# Patient Record
Sex: Female | Born: 1985 | Hispanic: Yes | Marital: Married | Smoking: Never smoker
Health system: Southern US, Community
[De-identification: ages and names within clinical notes are randomized; demographics above are authoritative.]

## PROBLEM LIST (undated history)

## (undated) DIAGNOSIS — E119 Type 2 diabetes mellitus without complications: Secondary | ICD-10-CM

## (undated) DIAGNOSIS — I319 Disease of pericardium, unspecified: Secondary | ICD-10-CM

## (undated) DIAGNOSIS — E162 Hypoglycemia, unspecified: Secondary | ICD-10-CM

## (undated) DIAGNOSIS — J918 Pleural effusion in other conditions classified elsewhere: Secondary | ICD-10-CM

## (undated) DIAGNOSIS — J189 Pneumonia, unspecified organism: Secondary | ICD-10-CM

## (undated) DIAGNOSIS — M069 Rheumatoid arthritis, unspecified: Secondary | ICD-10-CM

## (undated) DIAGNOSIS — D649 Anemia, unspecified: Secondary | ICD-10-CM

## (undated) HISTORY — PX: GASTRIC BYPASS: SHX52

## (undated) HISTORY — PX: TUBAL LIGATION: SHX77

## (undated) HISTORY — DX: Type 2 diabetes mellitus without complications: E11.9

---

## 2016-09-28 ENCOUNTER — Emergency Department (HOSPITAL_COMMUNITY)
Admission: EM | Admit: 2016-09-28 | Discharge: 2016-09-28 | Disposition: A | Discharge status: Home / Self Care | Payer: Medicaid Other | Attending: Emergency Medicine | Admitting: Emergency Medicine

## 2016-09-28 ENCOUNTER — Encounter (HOSPITAL_COMMUNITY): Payer: Self-pay | Admitting: *Deleted

## 2016-09-28 DIAGNOSIS — N390 Urinary tract infection, site not specified: Secondary | ICD-10-CM | POA: Diagnosis not present

## 2016-09-28 DIAGNOSIS — R1084 Generalized abdominal pain: Secondary | ICD-10-CM | POA: Diagnosis not present

## 2016-09-28 DIAGNOSIS — D649 Anemia, unspecified: Secondary | ICD-10-CM | POA: Diagnosis not present

## 2016-09-28 DIAGNOSIS — R339 Retention of urine, unspecified: Secondary | ICD-10-CM | POA: Diagnosis present

## 2016-09-28 HISTORY — DX: Anemia, unspecified: D64.9

## 2016-09-28 LAB — URINALYSIS, ROUTINE W REFLEX MICROSCOPIC
Bilirubin Urine: NEGATIVE
GLUCOSE, UA: NEGATIVE mg/dL
KETONES UR: NEGATIVE mg/dL
Nitrite: POSITIVE — AB
PROTEIN: NEGATIVE mg/dL
Specific Gravity, Urine: 1.011 (ref 1.005–1.030)
pH: 6 (ref 5.0–8.0)

## 2016-09-28 LAB — POC URINE PREG, ED: PREG TEST UR: NEGATIVE

## 2016-09-28 MED ORDER — CEFTRIAXONE SODIUM 1 G IJ SOLR
500.0000 mg | Freq: Once | INTRAMUSCULAR | Status: AC
  Administered 2016-09-28: 500 mg via INTRAMUSCULAR
  Filled 2016-09-28: qty 10

## 2016-09-28 MED ORDER — CEPHALEXIN 500 MG PO CAPS: 500.0000 mg | ORAL_CAPSULE | Freq: Four times a day (QID) | ORAL | 0 refills | Status: DC

## 2016-09-28 MED ORDER — HYDROCODONE-ACETAMINOPHEN 5-325 MG PO TABS: 1.0000 | ORAL_TABLET | ORAL | 0 refills | Status: DC | PRN

## 2016-09-28 NOTE — ED Notes (Signed)
Rocephin is not in any of ED Pyxis called pharmacy to receive medicine.

## 2016-09-28 NOTE — ED Provider Notes (Signed)
[facility]-EMERGENCY DEPT Provider Note   CSN: [id number] Arrival date & time: 09/28/16  1004     History   Chief Complaint Chief Complaint  Patient presents with  . Flank Pain  . Urinary Retention    HPI Pablo Mathurin is a 31 y.o. female.  Dysuria, frequency, urgency, for 2-3 days with associated right flank pain. No fever, sweats, chills. Patient is able to eat and drink. Status post BTL. She has had gastric bypass surgery. Severity of symptoms is mild to moderate. Nothing makes symptoms better or worse.      Past Medical History:  Diagnosis Date  . Anemia     There are no active problems to display for this patient.   Past Surgical History:  Procedure Laterality Date  . GASTRIC BYPASS      OB History    No data available       Home Medications    Prior to Admission medications   Medication Sig Start Date End Date Taking? Authorizing Provider  cephALEXin (KEFLEX) 500 MG capsule Take 1 capsule (500 mg total) by mouth 4 (four) times daily. 09/28/16   Donnetta Hutching, MD  HYDROcodone-acetaminophen (NORCO/VICODIN) 5-325 MG tablet Take 1-2 tablets by mouth every 4 (four) hours as needed. 09/28/16   Donnetta Hutching, MD    Family History History reviewed. No pertinent family history.  Social History Social History  Substance Use Topics  . Smoking status: Never Smoker  . Smokeless tobacco: Not on file  . Alcohol use No     Allergies   Patient has no known allergies.   Review of Systems Review of Systems  All other systems reviewed and are negative.    Physical Exam Updated Vital Signs BP 116/69 (BP Location: Right Arm)   Pulse 78   Temp 98 F (36.7 C) (Oral)   Resp 16   LMP 09/18/2016   SpO2 99%   Physical Exam  Constitutional: She is oriented to person, place, and time. She appears well-developed and well-nourished.  HENT:  Head: Normocephalic and atraumatic.  Eyes: Conjunctivae are normal.  Neck: Neck supple.  Cardiovascular: Normal rate and regular  rhythm.   Pulmonary/Chest: Effort normal and breath sounds normal.  Abdominal:  Minimal suprapubic tenderness  Genitourinary:  Genitourinary Comments: Minimal right flank tenderness  Musculoskeletal: Normal range of motion.  Neurological: She is alert and oriented to person, place, and time.  Skin: Skin is warm and dry.  Psychiatric: She has a normal mood and affect. Her behavior is normal.  Nursing note and vitals reviewed.    ED Treatments / Results  Labs (all labs ordered are listed, but only abnormal results are displayed) Labs Reviewed  URINALYSIS, ROUTINE W REFLEX MICROSCOPIC - Abnormal; Notable for the following:       Result Value   APPearance HAZY (*)    Hgb urine dipstick SMALL (*)    Nitrite POSITIVE (*)    Leukocytes, UA LARGE (*)    Bacteria, UA MANY (*)    Squamous Epithelial / LPF 0-5 (*)    All other components within normal limits  URINE CULTURE  POC URINE PREG, ED    EKG  EKG Interpretation None       Radiology No results found.  Procedures Procedures (including critical care time)  Medications Ordered in ED Medications  cefTRIAXone (ROCEPHIN) injection 500 mg (not administered)     Initial Impression / Assessment and Plan / ED Course  I have reviewed the triage vital signs and the nursing  notes.  Pertinent labs & imaging results that were available during my care of the patient were reviewed by me and considered in my medical decision making (see chart for details).     Patient is in no acute distress. She is not toxic or septic. Urinalysis shows evidence of infection. Will Rx Rocephin 1 g IM. Discharge medications Keflex 500 mg 4 times a day. Urine culture.  Final Clinical Impressions(s) / ED Diagnoses   Final diagnoses:  Urinary tract infection without hematuria, site unspecified    New Prescriptions New Prescriptions   CEPHALEXIN (KEFLEX) 500 MG CAPSULE    Take 1 capsule (500 mg total) by mouth 4 (four) times daily.    HYDROCODONE-ACETAMINOPHEN (NORCO/VICODIN) 5-325 MG TABLET    Take 1-2 tablets by mouth every 4 (four) hours as needed.     Donnetta Hutching, MD 09/28/16 [phone]

## 2016-09-28 NOTE — ED Notes (Signed)
Pt given specimen cup at triage but states unable to obtain sample at this time.

## 2016-09-28 NOTE — ED Triage Notes (Signed)
Pt reports having difficulty urinating that started yesterday. Reports now only able to pass small amounts of urine. Now has bilateral flank pain and feels like abd is distended. Denies fever.

## 2016-09-28 NOTE — Discharge Instructions (Signed)
You have a urinary infection. Increase fluids. Prescription for antibiotic and pain medicine. Start antibiotic this evening.

## 2016-09-28 NOTE — ED Notes (Signed)
Pt ambulated to restroom. 

## 2016-09-30 LAB — URINE CULTURE

## 2016-10-01 ENCOUNTER — Telehealth: Payer: Self-pay | Admitting: Emergency Medicine

## 2016-10-01 NOTE — Telephone Encounter (Signed)
Post ED Visit - Positive Culture Follow-up  Culture report reviewed by antimicrobial stewardship pharmacist:  []  , Pharm.D. []  Enzo Bi, Pharm.D., BCPS AQ-ID []  , Pharm.D., BCPS []  Celedonio Miyamoto, .D., BCPS []  Re[place], .D., BCPS, AAHIVP []  Georgina Pillion, Pharm.D., BCPS, AAHIVP [x]  [address], PharmD, BCPS []  , PharmD, BCPS []  [place], PharmD, BCPS  Positive urine culture Treated with cephalexin, organism sensitive to the same and no further patient follow-up is required at this time.  [place] 10/01/2016, 11:48 AM

## 2017-04-11 ENCOUNTER — Other Ambulatory Visit: Payer: Self-pay

## 2017-04-11 ENCOUNTER — Encounter (HOSPITAL_BASED_OUTPATIENT_CLINIC_OR_DEPARTMENT_OTHER): Payer: Self-pay

## 2017-04-11 ENCOUNTER — Emergency Department (HOSPITAL_BASED_OUTPATIENT_CLINIC_OR_DEPARTMENT_OTHER)
Admission: EM | Admit: 2017-04-11 | Discharge: 2017-04-12 | Disposition: A | Discharge status: Home / Self Care | Payer: Medicaid Other | Attending: Emergency Medicine | Admitting: Emergency Medicine

## 2017-04-11 DIAGNOSIS — N12 Tubulo-interstitial nephritis, not specified as acute or chronic: Secondary | ICD-10-CM | POA: Diagnosis not present

## 2017-04-11 DIAGNOSIS — R1030 Lower abdominal pain, unspecified: Secondary | ICD-10-CM | POA: Diagnosis present

## 2017-04-11 HISTORY — DX: Pneumonia, unspecified organism: J18.9

## 2017-04-11 HISTORY — DX: Pleural effusion in other conditions classified elsewhere: J91.8

## 2017-04-11 HISTORY — DX: Disease of pericardium, unspecified: I31.9

## 2017-04-11 LAB — CBC WITH DIFFERENTIAL/PLATELET
Basophils Absolute: 0 10*3/uL (ref 0.0–0.1)
Basophils Relative: 0 %
Eosinophils Absolute: 0.1 10*3/uL (ref 0.0–0.7)
Eosinophils Relative: 1 %
HEMATOCRIT: 33.8 % — AB (ref 36.0–46.0)
Hemoglobin: 11.4 g/dL — ABNORMAL LOW (ref 12.0–15.0)
LYMPHS ABS: 2.1 10*3/uL (ref 0.7–4.0)
LYMPHS PCT: 23 %
MCH: 29.5 pg (ref 26.0–34.0)
MCHC: 33.7 g/dL (ref 30.0–36.0)
MCV: 87.3 fL (ref 78.0–100.0)
MONOS PCT: 5 %
Monocytes Absolute: 0.5 10*3/uL (ref 0.1–1.0)
NEUTROS ABS: 6.5 10*3/uL (ref 1.7–7.7)
NEUTROS PCT: 71 %
Platelets: 325 10*3/uL (ref 150–400)
RBC: 3.87 MIL/uL (ref 3.87–5.11)
RDW: 13.4 % (ref 11.5–15.5)
WBC: 9.2 10*3/uL (ref 4.0–10.5)

## 2017-04-11 LAB — URINALYSIS, ROUTINE W REFLEX MICROSCOPIC
BILIRUBIN URINE: NEGATIVE
Glucose, UA: 100 mg/dL — AB
Ketones, ur: NEGATIVE mg/dL
Nitrite: NEGATIVE
PROTEIN: NEGATIVE mg/dL
Specific Gravity, Urine: 1.015 (ref 1.005–1.030)
pH: 6.5 (ref 5.0–8.0)

## 2017-04-11 LAB — URINALYSIS, MICROSCOPIC (REFLEX)

## 2017-04-11 LAB — PREGNANCY, URINE: PREG TEST UR: NEGATIVE

## 2017-04-11 LAB — COMPREHENSIVE METABOLIC PANEL
ALT: 14 U/L (ref 14–54)
ANION GAP: 7 (ref 5–15)
AST: 18 U/L (ref 15–41)
Albumin: 4.4 g/dL (ref 3.5–5.0)
Alkaline Phosphatase: 92 U/L (ref 38–126)
BILIRUBIN TOTAL: 0.3 mg/dL (ref 0.3–1.2)
BUN: 10 mg/dL (ref 6–20)
CALCIUM: 8.9 mg/dL (ref 8.9–10.3)
CO2: 24 mmol/L (ref 22–32)
CREATININE: 0.65 mg/dL (ref 0.44–1.00)
Chloride: 105 mmol/L (ref 101–111)
GFR calc non Af Amer: >60 mL/min (ref >60)
GLUCOSE: 88 mg/dL (ref 65–99)
Potassium: 4.1 mmol/L (ref 3.5–5.1)
Sodium: 136 mmol/L (ref 135–145)
TOTAL PROTEIN: 7.9 g/dL (ref 6.5–8.1)

## 2017-04-11 LAB — LIPASE, BLOOD: Lipase: 27 U/L (ref 11–51)

## 2017-04-11 MED ORDER — ONDANSETRON HCL 4 MG PO TABS: 4.0000 mg | ORAL_TABLET | Freq: Three times a day (TID) | ORAL | 0 refills | Status: DC | PRN

## 2017-04-11 MED ORDER — OXYCODONE-ACETAMINOPHEN 5-325 MG PO TABS: 1.0000 | ORAL_TABLET | ORAL | 0 refills | Status: DC | PRN

## 2017-04-11 MED ORDER — LEVOFLOXACIN 750 MG PO TABS
750.0000 mg | ORAL_TABLET | Freq: Once | ORAL | Status: AC
  Administered 2017-04-11: 750 mg via ORAL
  Filled 2017-04-11: qty 1

## 2017-04-11 MED ORDER — SODIUM CHLORIDE 0.9 % IV BOLUS (SEPSIS)
500.0000 mL | Freq: Once | INTRAVENOUS | Status: AC
  Administered 2017-04-11: 500 mL via INTRAVENOUS

## 2017-04-11 MED ORDER — LEVOFLOXACIN 750 MG PO TABS: 750.0000 mg | ORAL_TABLET | Freq: Every day | ORAL | 0 refills | Status: AC

## 2017-04-11 MED ORDER — MORPHINE SULFATE (PF) 4 MG/ML IV SOLN
4.0000 mg | Freq: Once | INTRAVENOUS | Status: AC
  Administered 2017-04-11: 4 mg via INTRAVENOUS
  Filled 2017-04-11: qty 1

## 2017-04-11 NOTE — Discharge Instructions (Signed)
Your work-up today fits with your history and exam revealing urinary tract infection and pyelonephritis.  Please use the antibiotic to treat the infection.  Please use the pain medicine and nausea medicine to help control your symptoms.  Please stay hydrated and rest.  If any symptoms change or worsen or you unable to manage your hydration, please return to the nearest emergency department.

## 2017-04-11 NOTE — ED Triage Notes (Signed)
C/o lower back pain, chills x 4 days-last dose tylenol 3pm-denies injury-NAD-steady gait

## 2017-04-11 NOTE — ED Provider Notes (Signed)
[facility] EMERGENCY DEPARTMENT Provider Note   CSN: [id number] Arrival date & time: 04/11/17  1818     History   Chief Complaint Chief Complaint  Patient presents with  . Back Pain    HPI Linda Shelton is a 32 y.o. female.  The history is provided by the patient and medical records. No language interpreter was used.  Flank Pain  This is a new problem. The current episode started more than 2 days ago. The problem occurs constantly. The problem has not changed since onset.Associated symptoms include abdominal pain. Pertinent negatives include no chest pain, no headaches and no shortness of breath. Nothing aggravates the symptoms. Nothing relieves the symptoms. She has tried nothing for the symptoms. The treatment provided no relief.    Past Medical History:  Diagnosis Date  . Anemia   . Pericarditis   . Pleural effusion associated with pulmonary infection     There are no active problems to display for this patient.   Past Surgical History:  Procedure Laterality Date  . GASTRIC BYPASS    . TUBAL LIGATION      OB History    No data available       Home Medications    Prior to Admission medications   Not on File    Family History No family history on file.  Social History Social History   Tobacco Use  . Smoking status: Never Smoker  . Smokeless tobacco: Never Used  Substance Use Topics  . Alcohol use: No  . Drug use: No     Allergies   Patient has no known allergies.   Review of Systems Review of Systems  Constitutional: Negative for chills, diaphoresis, fatigue and fever.  HENT: Negative for congestion.   Respiratory: Negative for choking, chest tightness and shortness of breath.   Cardiovascular: Negative for chest pain, palpitations and leg swelling.  Gastrointestinal: Positive for abdominal pain and nausea. Negative for constipation, diarrhea and vomiting.  Genitourinary: Positive for flank pain and frequency. Negative for  difficulty urinating, dysuria, vaginal bleeding, vaginal discharge and vaginal pain.  Musculoskeletal: Positive for back pain. Negative for neck pain and neck stiffness.  Skin: Negative for rash and wound.  Neurological: Negative for light-headedness and headaches.  Psychiatric/Behavioral: Negative for agitation and confusion.  All other systems reviewed and are negative.    Physical Exam Updated Vital Signs BP 108/72 (BP Location: Left Arm)   Pulse 80   Temp 98.4 F (36.9 C) (Oral)   Resp 18   Ht 5\' 3"  (1.6 m)   Wt 75.3 kg (166 lb)   LMP 04/07/2017   SpO2 100%   BMI 29.41 kg/m   Physical Exam  Constitutional: She is oriented to person, place, and time. She appears well-developed and well-nourished. No distress.  HENT:  Head: Normocephalic.  Nose: Nose normal.  Mouth/Throat: No oropharyngeal exudate.  Eyes: Conjunctivae and EOM are normal. Pupils are equal, round, and reactive to light.  Neck: Normal range of motion. Neck supple.  Cardiovascular: Normal rate, regular rhythm and intact distal pulses.  No murmur heard. Pulmonary/Chest: Effort normal and breath sounds normal. No respiratory distress. She has no wheezes. She has no rales. She exhibits no tenderness.  Abdominal: Normal appearance and bowel sounds are normal. She exhibits no distension and no mass. There is tenderness in the suprapubic area. There is CVA tenderness. There is no guarding.    Musculoskeletal: She exhibits tenderness. She exhibits no edema.       Lumbar  back: She exhibits tenderness and pain.       Back:  Lymphadenopathy:    She has no cervical adenopathy.  Neurological: She is alert and oriented to person, place, and time. She displays normal reflexes. No sensory deficit. She exhibits normal muscle tone.  Skin: Skin is warm. Capillary refill takes less than 2 seconds. No rash noted. She is not diaphoretic. No erythema.  Nursing note and vitals reviewed.    ED Treatments / Results  Labs (all  labs ordered are listed, but only abnormal results are displayed) Labs Reviewed  URINALYSIS, ROUTINE W REFLEX MICROSCOPIC - Abnormal; Notable for the following components:      Result Value   Glucose, UA 100 (*)    Hgb urine dipstick MODERATE (*)    Leukocytes, UA SMALL (*)    All other components within normal limits  URINALYSIS, MICROSCOPIC (REFLEX) - Abnormal; Notable for the following components:   Bacteria, UA FEW (*)    Squamous Epithelial / LPF 0-5 (*)    All other components within normal limits  CBC WITH DIFFERENTIAL/PLATELET - Abnormal; Notable for the following components:   Hemoglobin 11.4 (*)    HCT 33.8 (*)    All other components within normal limits  PREGNANCY, URINE  COMPREHENSIVE METABOLIC PANEL  LIPASE, BLOOD    EKG  EKG Interpretation None       Radiology No results found.  Procedures Procedures (including critical care time)  Medications Ordered in ED Medications  morphine 4 MG/ML injection 4 mg (4 mg Intravenous Given 04/11/17 2043)  sodium chloride 0.9 % bolus 500 mL (0 mLs Intravenous Stopped 04/11/17 2115)  levofloxacin (LEVAQUIN) tablet 750 mg (750 mg Oral Given 04/11/17 2332)     Initial Impression / Assessment and Plan / ED Course  I have reviewed the triage vital signs and the nursing notes.  Pertinent labs & imaging results that were available during my care of the patient were reviewed by me and considered in my medical decision making (see chart for details).     Linda Shelton is a 32 y.o. female with a past medical history significant for chronic anemia, prior pleural effusion, prior pericarditis, and remote history of gastric bypass surgery who presents with bilateral flank and back pain, some mild lower abdominal discomfort, and some mild diffuse headache.  Patient reports that for the last few days, she has had the bilateral flank and back pain.  She reports that she has not had fevers or chills.  She describes the pain radiating  around her flanks towards her lower mid abdomen.  She denies any pelvic pain, vaginal discharge and reports she just finished her menstrual cycle.  She denies any pelvic pain.  She denies any history of kidney stones but does report a history of UTIs.  She was concerned that she had a UTI or pyelonephritis.  She does report some diffuse headache but denies any nuchal rigidity or neck stiffness.  She denies any constipation, diarrhea, or upper respiratory symptoms.  He denies any chest pain, palpitations, or shortness of breath.  She denies any trauma to her back.  She reports that the back pain is a 6 out of 10 severity and is in both flanks and both lateral back.  She reports it as a sharp and needlelike pain bilaterally.  She reports it is worse with movement and palpation.  She reports taking Tylenol which did not help her symptoms fully.  She denies other complaints.    On exam,  patient does have bilateral CVA tenderness and bilateral flank tenderness.  Mid lower abdomen is slightly tender to palpation but the rest of her abdomen is benign.  No rebound tenderness or guarding.  No lower extremity tenderness or pain.  Patient had no focal neurologic deficits on my exam.  Lungs were clear and chest was nontender.  Normal neck range of motion.  Suspect UTI and pyelonephritis given the patient's symptoms.  With no history of kidney stone, suspect any hematuria to be due to her recent menstrual cycle.  Patient will have screening laboratory testing, urinalysis, and will also be given pain medicine during workup.  Given the benign abdominal exam, do not feel she has a combination with her gastric bypass.  Given her reassuring neck exam and neuro exam, do not feel patient has a meningitis.  Anticipate reassessment after workup.        Diagnostic laboratory testing seen above.  Lipase not elevated.  Metabolic panel reassuring.  CBC shows mild anemia but no leukocytosis.  Urinalysis did show evidence of leukocytes  and bacteria.  In the setting of the patient's abdominal discomfort and pain rating to her bilateral flanks and history of UTIs with some frequency, patient will be treated for UTI and pyelonephritis.   Patient did have some hemoglobin on her urine however she reports that she is finishing her menstrual cycle.  Suspect this is contamination.  Patient has no history of kidney stones and her symptoms are bilateral, doubt bilateral kidney stones at this time.    Patien was feeling better after medications.  Patient will be treated for pyelonephritis with antibiotics as well as nausea medication.  Pain medicine will also be given.  Patient will follow up with PCP in several days.  Patient understood return precautions for any new or worsened symptoms.  Patient had no depressions or concerns and was discharged in good condition.    Final Clinical Impressions(s) / ED Diagnoses   Final diagnoses:  Pyelonephritis    ED Discharge Orders        Ordered    levofloxacin (LEVAQUIN) 750 MG tablet  Daily     04/11/17 2320    ondansetron (ZOFRAN) 4 MG tablet  Every 8 hours PRN     04/11/17 2320    oxyCODONE-acetaminophen (PERCOCET/ROXICET) 5-325 MG tablet  Every 4 hours PRN     04/11/17 2320      Clinical Impression: 1. Pyelonephritis     Disposition: Discharge  Condition: Good  I have discussed the results, Dx and Tx plan with the pt(& family if present). He/she/they expressed understanding and agree(s) with the plan. Discharge instructions discussed at great length. Strict return precautions discussed and pt &/or family have verbalized understanding of the instructions. No further questions at time of discharge.    New Prescriptions   LEVOFLOXACIN (LEVAQUIN) 750 MG TABLET    Take 1 tablet (750 mg total) by mouth daily for 5 days.   ONDANSETRON (ZOFRAN) 4 MG TABLET    Take 1 tablet (4 mg total) by mouth every 8 (eight) hours as needed for nausea or vomiting.   OXYCODONE-ACETAMINOPHEN  (PERCOCET/ROXICET) 5-325 MG TABLET    Take 1 tablet by mouth every 4 (four) hours as needed for severe pain.    Follow Up: [facility] [address] [place] [address] [phone] Schedule an appointment as soon as possible for a visit       Kiara Keep, Canary Brim, MD 04/12/17 [phone]

## 2019-01-19 ENCOUNTER — Emergency Department (HOSPITAL_BASED_OUTPATIENT_CLINIC_OR_DEPARTMENT_OTHER)
Admission: EM | Admit: 2019-01-19 | Discharge: 2019-01-19 | Disposition: A | Discharge status: Home / Self Care | Payer: Medicaid Other | Attending: Emergency Medicine | Admitting: Emergency Medicine

## 2019-01-19 ENCOUNTER — Encounter (HOSPITAL_BASED_OUTPATIENT_CLINIC_OR_DEPARTMENT_OTHER): Payer: Self-pay | Admitting: *Deleted

## 2019-01-19 ENCOUNTER — Other Ambulatory Visit: Payer: Self-pay

## 2019-01-19 ENCOUNTER — Emergency Department (HOSPITAL_BASED_OUTPATIENT_CLINIC_OR_DEPARTMENT_OTHER): Payer: Medicaid Other

## 2019-01-19 DIAGNOSIS — M069 Rheumatoid arthritis, unspecified: Secondary | ICD-10-CM | POA: Insufficient documentation

## 2019-01-19 DIAGNOSIS — Z79899 Other long term (current) drug therapy: Secondary | ICD-10-CM | POA: Diagnosis not present

## 2019-01-19 DIAGNOSIS — J40 Bronchitis, not specified as acute or chronic: Secondary | ICD-10-CM

## 2019-01-19 DIAGNOSIS — R0981 Nasal congestion: Secondary | ICD-10-CM | POA: Insufficient documentation

## 2019-01-19 DIAGNOSIS — U071 COVID-19: Secondary | ICD-10-CM | POA: Diagnosis not present

## 2019-01-19 DIAGNOSIS — R05 Cough: Secondary | ICD-10-CM | POA: Diagnosis present

## 2019-01-19 HISTORY — DX: Rheumatoid arthritis, unspecified: M06.9

## 2019-01-19 HISTORY — DX: Hypoglycemia, unspecified: E16.2

## 2019-01-19 LAB — PREGNANCY, URINE: Preg Test, Ur: NEGATIVE

## 2019-01-19 MED ORDER — FLUTICASONE PROPIONATE 50 MCG/ACT NA SUSP: 1.0000 | Freq: Every day | NASAL | 1 refills | Status: DC

## 2019-01-19 MED ORDER — CETIRIZINE-PSEUDOEPHEDRINE ER 5-120 MG PO TB12: 1.0000 | ORAL_TABLET | Freq: Every day | ORAL | 0 refills | Status: DC

## 2019-01-19 MED ORDER — BENZONATATE 100 MG PO CAPS: 100.0000 mg | ORAL_CAPSULE | Freq: Three times a day (TID) | ORAL | 0 refills | Status: DC

## 2019-01-19 MED ORDER — ALBUTEROL SULFATE HFA 108 (90 BASE) MCG/ACT IN AERS: 1.0000 | INHALATION_SPRAY | Freq: Four times a day (QID) | RESPIRATORY_TRACT | 0 refills | Status: DC | PRN

## 2019-01-19 NOTE — ED Provider Notes (Signed)
[facility] EMERGENCY DEPARTMENT Provider Note   CSN: [id number] Arrival date & time: 01/19/19  1350     History Chief Complaint  Patient presents with  . Chest Pain    Linda Shelton is a 33 y.o. female   HPI  Patient is a 33 year old female with a history of pericarditis, rheumatoid arthritis and bronchitis presents today with 6 days of cough and congestion.  Patient states cough is productive of clear/yellow sputum.  States that she has had some sinus congestion which is draining well however she does feel there is some sinus pressure at times.  Denies any fevers.  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  Patient denies any loss of taste or smell.  Patient does endorse some chest tightness that occurs when she coughs.  Patient states she has a history of pericarditis that occurred 4 years ago and had a pleural effusion at that time.  States that her symptoms do not feel similar to this at this time denies any pleuritic chest pain, pain with reclining, shortness of breath apart from coughing spells.  Patient states she used her daughter's nebulized albuterol last night which improved her symptoms significantly.  Patient states she is used some over-the-counter medications including Zyrtec with some improvement.  Patient has no hemoptysis, history of blood clots, diabetes, hypertension, hyperlipidemia, first-degree relative with heart attack under 65, obesity.  Patient has had no surgeries in the past 2 months states that she is very mobile at home.  Denies immobility.  Denies leg swelling cramping or swelling.     Past Medical History:  Diagnosis Date  . Anemia   . Hypoglycemia   . Pericarditis   . Pleural effusion associated with pulmonary infection   . RA (rheumatoid arthritis) (HCC)     There are no problems to display for this patient.   Past Surgical History:   Procedure Laterality Date  . GASTRIC BYPASS    . TUBAL LIGATION       OB History   No obstetric history on file.     No family history on file.  Social History   Tobacco Use  . Smoking status: Never Smoker  . Smokeless tobacco: Never Used  Substance Use Topics  . Alcohol use: No  . Drug use: No    Home Medications Prior to Admission medications   Medication Sig Start Date End Date Taking? Authorizing Provider  acarbose (PRECOSE) 50 MG tablet Take by mouth. 11/21/18  Yes [provider]  hydroxychloroquine (PLAQUENIL) 200 MG tablet Take by mouth. 10/08/18  Yes [provider]  omeprazole (PRILOSEC) 20 MG capsule Take by mouth. 08/15/18  Yes [provider]  predniSONE (DELTASONE) 5 MG tablet Take by mouth. 10/08/18  Yes [provider]  Prenatal Vit-Fe Fumarate-FA (PRENATAL MULTIVITAMIN) TABS tablet Take 1 tablet by mouth daily at 12 noon.   Yes [provider]  albuterol (VENTOLIN HFA) 108 (90 Base) MCG/ACT inhaler Inhale 1-2 puffs into the lungs every 6 (six) hours as needed for wheezing or shortness of breath. 01/19/19   Tedd Sias, PA  ascorbic acid (VITAMIN C) 1000 MG tablet Take by mouth.    [provider]  benzonatate (TESSALON) 100 MG capsule Take 1 capsule (100 mg total) by mouth every 8 (eight) hours. 01/19/19   Tedd Sias, PA  cetirizine-pseudoephedrine (ZYRTEC-D) 5-120 MG tablet Take 1 tablet  by mouth daily. 01/19/19   Kemper Hochman, Rodrigo RanWylder S, PA  fluticasone (FLONASE) 50 MCG/ACT nasal spray Place 1 spray into both nostrils daily. 01/19/19   Chez Bulnes, Rodrigo RanWylder S, PA  ondansetron (ZOFRAN) 4 MG tablet Take 1 tablet (4 mg total) by mouth every 8 (eight) hours as needed for nausea or vomiting. 04/11/17   Tegeler, Canary Brimhristopher J, MD  oxyCODONE-acetaminophen (PERCOCET/ROXICET) 5-325 MG tablet Take 1 tablet by mouth every 4 (four) hours as needed for severe pain. 04/11/17   Tegeler, Canary Brimhristopher J, MD    Allergies     Patient has no known allergies.  Review of Systems   Review of Systems  Constitutional: Positive for fatigue. Negative for chills and fever.  HENT: Positive for congestion.   Eyes: Negative for pain.  Respiratory: Positive for cough. Negative for shortness of breath and wheezing.   Cardiovascular: Negative for chest pain, palpitations and leg swelling.  Gastrointestinal: Negative for abdominal pain and vomiting.  Genitourinary: Negative for dysuria.  Musculoskeletal: Negative for myalgias.       Diffuse body aches  Skin: Negative for rash.  Neurological: Negative for dizziness and headaches.    Physical Exam Updated Vital Signs BP 115/71 (BP Location: Right Arm)   Pulse 81   Temp 98.3 F (36.8 C) (Oral)   Resp 16   Ht 5\' 3"  (1.6 m)   Wt 81.2 kg   SpO2 100%   BMI 31.71 kg/m   Physical Exam Vitals and nursing note reviewed.  Constitutional:      General: She is not in acute distress.    Comments: Patient is pleasant 33 year old female appears stated age speaking full sentences able to answer questions appropriately no increased work of breathing or respiratory distress.  Respiratory rate is 16-18 during ED provider evaluation  HENT:     Head: Normocephalic and atraumatic.     Nose: Nose normal.  Eyes:     General: No scleral icterus. Cardiovascular:     Rate and Rhythm: Normal rate and regular rhythm.     Pulses: Normal pulses.     Heart sounds: Normal heart sounds.     Comments: Pulses all 4 extremities. Pulmonary:     Effort: Pulmonary effort is normal. No respiratory distress.     Breath sounds: No wheezing.     Comments: Normal work of breathing.  No tachypnea Abdominal:     Palpations: Abdomen is soft.     Tenderness: There is no abdominal tenderness.  Musculoskeletal:     Cervical back: Normal range of motion.     Right lower leg: No edema.     Left lower leg: No edema.     Comments: No lower extremity edema swelling or calf tenderness  Skin:    General:  Skin is warm and dry.     Capillary Refill: Capillary refill takes less than 2 seconds.  Neurological:     Mental Status: She is alert. Mental status is at baseline.  Psychiatric:        Mood and Affect: Mood normal.        Behavior: Behavior normal.     ED Results / Procedures / Treatments   Labs (all labs ordered are listed, but only abnormal results are displayed) Labs Reviewed  SARS CORONAVIRUS 2 (TAT 6-24 HRS)  PREGNANCY, URINE    EKG EKG Interpretation  Date/Time:  Saturday January 19 2019 13:57:49 EST Ventricular Rate:  98 PR Interval:  116 QRS Duration: 90 QT Interval:  338 QTC Calculation: 431 R  Axis:   66 Text Interpretation: Normal sinus rhythm Normal ECG No old tracing to compare Confirmed by Meridee ScoreButler, Michael [phone]([id number]) on 01/19/2019 2:02:35 PM   Radiology DG Chest 2 View  Result Date: 01/19/2019 CLINICAL DATA:  Mid chest and upper back pain with cough and shortness of breath for 6-7 days. History of pericarditis and bronchitis. EXAM: CHEST - 2 VIEW COMPARISON:  Radiographs 10/07/2015. FINDINGS: The heart size and mediastinal contours are normal. The lungs are clear. There is no pleural effusion or pneumothorax. No acute osseous findings are identified. Stable mild thoracolumbar scoliosis. IMPRESSION: Stable chest. No active cardiopulmonary process. Electronically Signed   By: Carey BullocksWilliam  Veazey M.D.   On: 01/19/2019 15:02    Procedures Procedures (including critical care time)  Medications Ordered in ED Medications - No data to display  ED Course  I have reviewed the triage vital signs and the nursing notes.  Pertinent labs & imaging results that were available during my care of the patient were reviewed by me and considered in my medical decision making (see chart for details).  Clinical Course as of Jan 18 1837  Sat Jan 19, 2019  1558 With no evidence of ischemia.  No evidence of hokum, WPW, LGG, QT normal.   EKG 12-Lead [WF]  1837 Chest x-ray with no  evidence of infiltrate, also enlargement of the heart.  I personally reviewed the x-ray did not see any signs of pericardial effusion.  DG Chest 2 View [WF]  1837 Negative pregnancy test.  Pregnancy, urine [WF]  1837 Send out Covid lab pending at time of discharge  SARS CORONAVIRUS 2 (TAT 6-24 HRS) Nasopharyngeal Nasopharyngeal Swab [WF]    Clinical Course User Index [WF] Gailen ShelterFondaw, Mercer Stallworth S, PA   MDM Rules/Calculators/A&P                      Patient is a 33 year old female with a history of pericarditis, rheumatoid arthritis and bronchitis presents today with 6 days of cough and congestion.  Patient states cough is productive of clear/yellow sputum.  States that she has had some sinus congestion which is draining well however she does feel there is some sinus pressure at times.  Denies any fevers.  See chest x-ray and other lab test interpretation above.  Patient has vitals are normal limits.  No evidence of pericardial effusion on chest x-ray.  EKG without any evidence of pericarditis no PR depression or ST elevation.  Discussed with patient that physical exam are most consistent with bronchitis.  Will provide patient with albuterol inhaler, benzonatate for cough suppression, Zyrtec-D as patient has no hypertension and is complaining of severe congestion.  Also prescribed fluticasone.  Doubt the patient has sinus infection as she has had symptoms for less than 10 days.  Also she is afebrile and has no maxillary sinus tenderness or frontal sinus tenderness.  Discussed return precautions with patient she is understanding of these.  Doubt pulmonary embolism as patient has a history of clots and is not tachycardic nor is she hypoxic or tachypneic.  Denies any chest pain.   Final Clinical Impression(s) / ED Diagnoses Final diagnoses:  Bronchitis  Sinus congestion    Rx / DC Orders ED Discharge Orders         Ordered    cetirizine-pseudoephedrine (ZYRTEC-D) 5-120 MG tablet  Daily     01/19/19  1642    fluticasone (FLONASE) 50 MCG/ACT nasal spray  Daily     01/19/19 1642    benzonatate (  TESSALON) 100 MG capsule  Every 8 hours     01/19/19 1642    albuterol (VENTOLIN HFA) 108 (90 Base) MCG/ACT inhaler  Every 6 hours PRN     01/19/19 1642           Gailen Shelter, [place] 01/19/19 2023    Laurence Spates, MD 01/20/19 1524

## 2019-01-19 NOTE — Discharge Instructions (Addendum)
I diagnosed you with bronchitis today.  He also have some sinus congestion.  I will prescribe you albuterol inhaler, benzonatate which he can use as needed for cough during the daytime as it is nondrowsy.  Continue to use the cough syrup you have been using at nighttime.  I have also prescribed fluticasone which you should use 1 spray into both nostrils daily.  Lastly I prescribed you Zyrtec-D which has Sudafed in it.  You should take this instead of Zyrtec or Sudafed by itself.  Please return to ED if you have any new or concerning symptoms.  Your COVID test is pending; you should expect results in 2-3 days. You can access your results on your MyChart--if you test positive you should receive a phone call.  In the meantime follow CDC guidelines and quarantine, wear a mask, wash hands often.   Please take over the counter vitamin D 2000-4000 units per day. I also recommend zinc 50 mg per day for the next two weeks.   Please return to ED if you feel have difficulty breathing or have emergent, new or concerning symptoms.  Patients who have symptoms consistent with COVID-19 should self isolated for: At least 3 days (72 hours) have passed since recovery, defined as resolution of fever without the use of fever reducing medications and improvement in respiratory symptoms (e.g., cough, shortness of breath), and At least 7 days have passed since symptoms first appeared.       Person Under Monitoring Name: Linda Shelton  Location: [address] [place] [address]   Infection Prevention Recommendations for Individuals Confirmed to have, or Being Evaluated for, 2019 Novel Coronavirus (COVID-19) Infection Who Receive Care at Home  Individuals who are confirmed to have, or are being evaluated for, COVID-19 should follow the prevention steps below until a healthcare provider or local or state health department says they can return to normal activities.  Stay home except to get medical care You  should restrict activities outside your home, except for getting medical care. Do not go to work, school, or public areas, and do not use public transportation or taxis.  Call ahead before visiting your doctor Before your medical appointment, call the healthcare provider and tell them that you have, or are being evaluated for, COVID-19 infection. This will help the healthcare provider's office take steps to keep other people from getting infected. Ask your healthcare provider to call the local or state health department.  Monitor your symptoms Seek prompt medical attention if your illness is worsening (e.g., difficulty breathing). Before going to your medical appointment, call the healthcare provider and tell them that you have, or are being evaluated for, COVID-19 infection. Ask your healthcare provider to call the local or state health department.  Wear a facemask You should wear a facemask that covers your nose and mouth when you are in the same room with other people and when you visit a healthcare provider. People who live with or visit you should also wear a facemask while they are in the same room with you.  Separate yourself from other people in your home As much as possible, you should stay in a different room from other people in your home. Also, you should use a separate bathroom, if available.  Avoid sharing household items You should not share dishes, drinking glasses, cups, eating utensils, towels, bedding, or other items with other people in your home. After using these items, you should wash them thoroughly with soap and water.  Cover  your coughs and sneezes Cover your mouth and nose with a tissue when you cough or sneeze, or you can cough or sneeze into your sleeve. Throw used tissues in a lined trash can, and immediately wash your hands with soap and water for at least 20 seconds or use an alcohol-based hand rub.  Wash your [employer] your hands often and thoroughly  with soap and water for at least 20 seconds. You can use an alcohol-based hand sanitizer if soap and water are not available and if your hands are not visibly dirty. Avoid touching your eyes, nose, and mouth with unwashed hands.   Prevention Steps for Caregivers and Household Members of Individuals Confirmed to have, or Being Evaluated for, COVID-19 Infection Being Cared for in the Home  If you live with, or provide care at home for, a person confirmed to have, or being evaluated for, COVID-19 infection please follow these guidelines to prevent infection:  Follow healthcare provider's instructions Make sure that you understand and can help the patient follow any healthcare provider instructions for all care.  Provide for the patient's basic needs You should help the patient with basic needs in the home and provide support for getting groceries, prescriptions, and other personal needs.  Monitor the patient's symptoms If they are getting sicker, call his or her medical provider and tell them that the patient has, or is being evaluated for, COVID-19 infection. This will help the healthcare provider's office take steps to keep other people from getting infected. Ask the healthcare provider to call the local or state health department.  Limit the number of people who have contact with the patient If possible, have only one caregiver for the patient. Other household members should stay in another home or place of residence. If this is not possible, they should stay in another room, or be separated from the patient as much as possible. Use a separate bathroom, if available. Restrict visitors who do not have an essential need to be in the home.  Keep older adults, very young children, and other sick people away from the patient Keep older adults, very young children, and those who have compromised immune systems or chronic health conditions away from the patient. This includes people with  chronic heart, lung, or kidney conditions, diabetes, and cancer.  Ensure good ventilation Make sure that shared spaces in the home have good air flow, such as from an air conditioner or an opened window, weather permitting.  Wash your hands often Wash your hands often and thoroughly with soap and water for at least 20 seconds. You can use an alcohol based hand sanitizer if soap and water are not available and if your hands are not visibly dirty. Avoid touching your eyes, nose, and mouth with unwashed hands. Use disposable paper towels to dry your hands. If not available, use dedicated cloth towels and replace them when they become wet.  Wear a facemask and gloves Wear a disposable facemask at all times in the room and gloves when you touch or have contact with the patient's blood, body fluids, and/or secretions or excretions, such as sweat, saliva, sputum, nasal mucus, vomit, urine, or feces.  Ensure the mask fits over your nose and mouth tightly, and do not touch it during use. Throw out disposable facemasks and gloves after using them. Do not reuse. Wash your hands immediately after removing your facemask and gloves. If your personal clothing becomes contaminated, carefully remove clothing and launder. Wash your hands after handling contaminated  clothing. Place all used disposable facemasks, gloves, and other waste in a lined container before disposing them with other household waste. Remove gloves and wash your hands immediately after handling these items.  Do not share dishes, glasses, or other household items with the patient Avoid sharing household items. You should not share dishes, drinking glasses, cups, eating utensils, towels, bedding, or other items with a patient who is confirmed to have, or being evaluated for, COVID-19 infection. After the person uses these items, you should wash them thoroughly with soap and water.  Wash laundry thoroughly Immediately remove and wash clothes  or bedding that have blood, body fluids, and/or secretions or excretions, such as sweat, saliva, sputum, nasal mucus, vomit, urine, or feces, on them. Wear gloves when handling laundry from the patient. Read and follow directions on labels of laundry or clothing items and detergent. In general, wash and dry with the warmest temperatures recommended on the label.  Clean all areas the individual has used often Clean all touchable surfaces, such as counters, tabletops, doorknobs, bathroom fixtures, toilets, phones, keyboards, tablets, and bedside tables, every day. Also, clean any surfaces that may have blood, body fluids, and/or secretions or excretions on them. Wear gloves when cleaning surfaces the patient has come in contact with. Use a diluted bleach solution (e.g., dilute bleach with 1 part bleach and 10 parts water) or a household disinfectant with a label that says EPA-registered for coronaviruses. To make a bleach solution at home, add 1 tablespoon of bleach to 1 quart (4 cups) of water. For a larger supply, add  cup of bleach to 1 gallon (16 cups) of water. Read labels of cleaning products and follow recommendations provided on product labels. Labels contain instructions for safe and effective use of the cleaning product including precautions you should take when applying the product, such as wearing gloves or eye protection and making sure you have good ventilation during use of the product. Remove gloves and wash hands immediately after cleaning.  Monitor yourself for signs and symptoms of illness Caregivers and household members are considered close contacts, should monitor their health, and will be asked to limit movement outside of the home to the extent possible. Follow the monitoring steps for close contacts listed on the symptom monitoring form.   ? If you have additional questions, contact your local health department or call the epidemiologist on call at [phone] (available  24/7). ? This guidance is subject to change. For the most up-to-date guidance from [facility], please refer to their website: YouBlogs.pl

## 2019-01-19 NOTE — ED Triage Notes (Signed)
Pt reports generalized chest tightness x 3-4 days and feels SOB. Had neg covid test on 12/10 and has been home since. States she often gets bronchitis this time of year

## 2019-01-20 LAB — SARS CORONAVIRUS 2 (TAT 6-24 HRS): SARS Coronavirus 2: POSITIVE — AB

## 2019-03-03 ENCOUNTER — Emergency Department (HOSPITAL_COMMUNITY): Payer: Medicaid Other

## 2019-03-03 ENCOUNTER — Encounter (HOSPITAL_COMMUNITY)
Admission: EM | Disposition: A | Discharge status: Home / Self Care | Payer: Self-pay | Source: Home / Self Care | Attending: Emergency Medicine

## 2019-03-03 ENCOUNTER — Emergency Department (HOSPITAL_COMMUNITY): Payer: Medicaid Other | Admitting: Anesthesiology

## 2019-03-03 ENCOUNTER — Other Ambulatory Visit: Payer: Self-pay

## 2019-03-03 ENCOUNTER — Encounter (HOSPITAL_COMMUNITY): Payer: Self-pay | Admitting: Emergency Medicine

## 2019-03-03 ENCOUNTER — Observation Stay (HOSPITAL_COMMUNITY)
Admission: EM | Admit: 2019-03-03 | Discharge: 2019-03-04 | Disposition: A | Discharge status: Home / Self Care | Payer: Medicaid Other | Attending: General Surgery | Admitting: General Surgery

## 2019-03-03 DIAGNOSIS — M1990 Unspecified osteoarthritis, unspecified site: Secondary | ICD-10-CM | POA: Diagnosis not present

## 2019-03-03 DIAGNOSIS — Z79899 Other long term (current) drug therapy: Secondary | ICD-10-CM | POA: Insufficient documentation

## 2019-03-03 DIAGNOSIS — K3580 Unspecified acute appendicitis: Secondary | ICD-10-CM | POA: Diagnosis not present

## 2019-03-03 DIAGNOSIS — Z9884 Bariatric surgery status: Secondary | ICD-10-CM | POA: Insufficient documentation

## 2019-03-03 DIAGNOSIS — M069 Rheumatoid arthritis, unspecified: Secondary | ICD-10-CM | POA: Insufficient documentation

## 2019-03-03 DIAGNOSIS — D649 Anemia, unspecified: Secondary | ICD-10-CM | POA: Insufficient documentation

## 2019-03-03 DIAGNOSIS — Z8616 Personal history of COVID-19: Secondary | ICD-10-CM | POA: Insufficient documentation

## 2019-03-03 DIAGNOSIS — Z7952 Long term (current) use of systemic steroids: Secondary | ICD-10-CM | POA: Diagnosis not present

## 2019-03-03 DIAGNOSIS — K5900 Constipation, unspecified: Secondary | ICD-10-CM | POA: Insufficient documentation

## 2019-03-03 DIAGNOSIS — E162 Hypoglycemia, unspecified: Secondary | ICD-10-CM | POA: Diagnosis not present

## 2019-03-03 DIAGNOSIS — K353 Acute appendicitis with localized peritonitis, without perforation or gangrene: Secondary | ICD-10-CM

## 2019-03-03 DIAGNOSIS — J90 Pleural effusion, not elsewhere classified: Secondary | ICD-10-CM | POA: Insufficient documentation

## 2019-03-03 HISTORY — PX: LAPAROSCOPIC APPENDECTOMY: SHX408

## 2019-03-03 LAB — URINALYSIS, ROUTINE W REFLEX MICROSCOPIC
Bilirubin Urine: NEGATIVE
Glucose, UA: NEGATIVE mg/dL
Hgb urine dipstick: NEGATIVE
Ketones, ur: NEGATIVE mg/dL
Leukocytes,Ua: NEGATIVE
Nitrite: NEGATIVE
Protein, ur: NEGATIVE mg/dL
Specific Gravity, Urine: 1.013 (ref 1.005–1.030)
pH: 6 (ref 5.0–8.0)

## 2019-03-03 LAB — COMPREHENSIVE METABOLIC PANEL
ALT: 14 U/L (ref 0–44)
AST: 22 U/L (ref 15–41)
Albumin: 4 g/dL (ref 3.5–5.0)
Alkaline Phosphatase: 65 U/L (ref 38–126)
Anion gap: 13 (ref 5–15)
BUN: 9 mg/dL (ref 6–20)
CO2: 22 mmol/L (ref 22–32)
Calcium: 9 mg/dL (ref 8.9–10.3)
Chloride: 104 mmol/L (ref 98–111)
Creatinine, Ser: 0.74 mg/dL (ref 0.44–1.00)
GFR calc Af Amer: >60 mL/min (ref >60)
GFR calc non Af Amer: >60 mL/min (ref >60)
Glucose, Bld: 76 mg/dL (ref 70–99)
Potassium: 3.8 mmol/L (ref 3.5–5.1)
Sodium: 139 mmol/L (ref 135–145)
Total Bilirubin: 0.5 mg/dL (ref 0.3–1.2)
Total Protein: 7.4 g/dL (ref 6.5–8.1)

## 2019-03-03 LAB — CBC WITH DIFFERENTIAL/PLATELET
Abs Immature Granulocytes: 0.06 10*3/uL (ref 0.00–0.07)
Basophils Absolute: 0 10*3/uL (ref 0.0–0.1)
Basophils Relative: 0 %
Eosinophils Absolute: 0 10*3/uL (ref 0.0–0.5)
Eosinophils Relative: 0 %
HCT: 37.8 % (ref 36.0–46.0)
Hemoglobin: 12.1 g/dL (ref 12.0–15.0)
Immature Granulocytes: 0 %
Lymphocytes Relative: 6 %
Lymphs Abs: 0.8 10*3/uL (ref 0.7–4.0)
MCH: 28.7 pg (ref 26.0–34.0)
MCHC: 32 g/dL (ref 30.0–36.0)
MCV: 89.8 fL (ref 80.0–100.0)
Monocytes Absolute: 0.5 10*3/uL (ref 0.1–1.0)
Monocytes Relative: 4 %
Neutro Abs: 12.2 10*3/uL — ABNORMAL HIGH (ref 1.7–7.7)
Neutrophils Relative %: 90 %
Platelets: 284 10*3/uL (ref 150–400)
RBC: 4.21 MIL/uL (ref 3.87–5.11)
RDW: 14.3 % (ref 11.5–15.5)
WBC: 13.6 10*3/uL — ABNORMAL HIGH (ref 4.0–10.5)
nRBC: 0 % (ref 0.0–0.2)

## 2019-03-03 LAB — GLUCOSE, CAPILLARY
Glucose-Capillary: 80 mg/dL (ref 70–99)
Glucose-Capillary: 78 mg/dL (ref 70–99)

## 2019-03-03 LAB — LIPASE, BLOOD: Lipase: 25 U/L (ref 11–51)

## 2019-03-03 LAB — I-STAT BETA HCG BLOOD, ED (MC, WL, AP ONLY): I-stat hCG, quantitative: <5 m[IU]/mL (ref <5)

## 2019-03-03 SURGERY — APPENDECTOMY, LAPAROSCOPIC
Anesthesia: General | Site: Abdomen

## 2019-03-03 MED ORDER — SODIUM CHLORIDE 0.9 % IR SOLN
Status: DC | PRN
  Administered 2019-03-03: 1000 mL

## 2019-03-03 MED ORDER — TRAMADOL HCL 50 MG PO TABS: 50.0000 mg | ORAL_TABLET | Freq: Four times a day (QID) | ORAL | Status: DC | PRN

## 2019-03-03 MED ORDER — FENTANYL CITRATE (PF) 250 MCG/5ML IJ SOLN
INTRAMUSCULAR | Status: AC
  Filled 2019-03-03: qty 5

## 2019-03-03 MED ORDER — BUPIVACAINE-EPINEPHRINE (PF) 0.25% -1:200000 IJ SOLN
INTRAMUSCULAR | Status: DC | PRN
  Administered 2019-03-03: 3.5 mL

## 2019-03-03 MED ORDER — PROPOFOL 10 MG/ML IV BOLUS
INTRAVENOUS | Status: DC | PRN
  Administered 2019-03-03: 160 mg via INTRAVENOUS

## 2019-03-03 MED ORDER — OXYCODONE HCL 5 MG PO TABS
5.0000 mg | ORAL_TABLET | ORAL | Status: DC | PRN
  Administered 2019-03-03: 5 mg via ORAL
  Administered 2019-03-04: 10 mg via ORAL
  Administered 2019-03-04: 07:00:00 5 mg via ORAL
  Filled 2019-03-03: qty 1
  Filled 2019-03-03: qty 2
  Filled 2019-03-03: qty 1

## 2019-03-03 MED ORDER — HYDROMORPHONE HCL 1 MG/ML IJ SOLN
1.0000 mg | Freq: Once | INTRAMUSCULAR | Status: AC
  Administered 2019-03-03: 14:00:00 1 mg via INTRAVENOUS
  Filled 2019-03-03: qty 1

## 2019-03-03 MED ORDER — FENTANYL CITRATE (PF) 100 MCG/2ML IJ SOLN
INTRAMUSCULAR | Status: DC | PRN
  Administered 2019-03-03: 100 ug via INTRAVENOUS
  Administered 2019-03-03: 50 ug via INTRAVENOUS

## 2019-03-03 MED ORDER — BUPIVACAINE HCL (PF) 0.25 % IJ SOLN
INTRAMUSCULAR | Status: AC
  Filled 2019-03-03: qty 30

## 2019-03-03 MED ORDER — ALBUTEROL SULFATE HFA 108 (90 BASE) MCG/ACT IN AERS: 1.0000 | INHALATION_SPRAY | Freq: Four times a day (QID) | RESPIRATORY_TRACT | Status: DC | PRN

## 2019-03-03 MED ORDER — PROPOFOL 10 MG/ML IV BOLUS
INTRAVENOUS | Status: AC
  Filled 2019-03-03: qty 20

## 2019-03-03 MED ORDER — DEXAMETHASONE SODIUM PHOSPHATE 10 MG/ML IJ SOLN
INTRAMUSCULAR | Status: AC
  Filled 2019-03-03: qty 1

## 2019-03-03 MED ORDER — ENOXAPARIN SODIUM 40 MG/0.4ML ~~LOC~~ SOLN
40.0000 mg | SUBCUTANEOUS | Status: DC
  Filled 2019-03-03: qty 0.4

## 2019-03-03 MED ORDER — PRENATAL MULTIVITAMIN CH
1.0000 | ORAL_TABLET | Freq: Every day | ORAL | Status: DC
  Filled 2019-03-03: qty 1

## 2019-03-03 MED ORDER — LORATADINE 10 MG PO TABS: 10.0000 mg | ORAL_TABLET | Freq: Every day | ORAL | Status: DC

## 2019-03-03 MED ORDER — 0.9 % SODIUM CHLORIDE (POUR BTL) OPTIME
TOPICAL | Status: DC | PRN
  Administered 2019-03-03: 1000 mL

## 2019-03-03 MED ORDER — MIDAZOLAM HCL 2 MG/2ML IJ SOLN
INTRAMUSCULAR | Status: AC
  Filled 2019-03-03: qty 2

## 2019-03-03 MED ORDER — ACETAMINOPHEN 650 MG RE SUPP: 650.0000 mg | Freq: Four times a day (QID) | RECTAL | Status: DC | PRN

## 2019-03-03 MED ORDER — SODIUM CHLORIDE 0.9 % IV BOLUS
1000.0000 mL | Freq: Once | INTRAVENOUS | Status: AC
  Administered 2019-03-03: 1000 mL via INTRAVENOUS

## 2019-03-03 MED ORDER — HYDROMORPHONE HCL 1 MG/ML IJ SOLN
1.0000 mg | Freq: Once | INTRAMUSCULAR | Status: AC
  Administered 2019-03-03: 11:00:00 1 mg via INTRAVENOUS
  Filled 2019-03-03: qty 1

## 2019-03-03 MED ORDER — SENNA 8.6 MG PO TABS
1.0000 | ORAL_TABLET | Freq: Two times a day (BID) | ORAL | Status: DC
  Administered 2019-03-03 – 2019-03-04 (×2): 8.6 mg via ORAL
  Filled 2019-03-03 (×2): qty 1

## 2019-03-03 MED ORDER — PANTOPRAZOLE SODIUM 40 MG PO TBEC
40.0000 mg | DELAYED_RELEASE_TABLET | Freq: Every day | ORAL | Status: DC
  Administered 2019-03-03 – 2019-03-04 (×2): 40 mg via ORAL
  Filled 2019-03-03 (×3): qty 1

## 2019-03-03 MED ORDER — ASCORBIC ACID 500 MG PO TABS
1000.0000 mg | ORAL_TABLET | Freq: Every day | ORAL | Status: DC
  Administered 2019-03-03 – 2019-03-04 (×2): 1000 mg via ORAL
  Filled 2019-03-03 (×2): qty 2

## 2019-03-03 MED ORDER — ONDANSETRON HCL 4 MG/2ML IJ SOLN
INTRAMUSCULAR | Status: DC | PRN
  Administered 2019-03-03: 4 mg via INTRAVENOUS

## 2019-03-03 MED ORDER — LIDOCAINE 2% (20 MG/ML) 5 ML SYRINGE
INTRAMUSCULAR | Status: AC
  Filled 2019-03-03: qty 5

## 2019-03-03 MED ORDER — PREDNISONE 5 MG PO TABS
5.0000 mg | ORAL_TABLET | Freq: Every day | ORAL | Status: DC
  Administered 2019-03-04: 10:00:00 5 mg via ORAL
  Filled 2019-03-03: qty 1

## 2019-03-03 MED ORDER — PIPERACILLIN-TAZOBACTAM 3.375 G IVPB 30 MIN
3.3750 g | Freq: Once | INTRAVENOUS | Status: AC
  Administered 2019-03-03: 12:00:00 3.375 g via INTRAVENOUS
  Filled 2019-03-03: qty 50

## 2019-03-03 MED ORDER — HYDROMORPHONE HCL 1 MG/ML IJ SOLN: 0.2500 mg | INTRAMUSCULAR | Status: DC | PRN

## 2019-03-03 MED ORDER — ACETAMINOPHEN 325 MG PO TABS: 650.0000 mg | ORAL_TABLET | Freq: Four times a day (QID) | ORAL | Status: DC | PRN

## 2019-03-03 MED ORDER — LIDOCAINE 2% (20 MG/ML) 5 ML SYRINGE
INTRAMUSCULAR | Status: DC | PRN
  Administered 2019-03-03: 60 mg via INTRAVENOUS

## 2019-03-03 MED ORDER — SUGAMMADEX SODIUM 200 MG/2ML IV SOLN
INTRAVENOUS | Status: DC | PRN
  Administered 2019-03-03: 200 mg via INTRAVENOUS

## 2019-03-03 MED ORDER — SODIUM CHLORIDE 0.9 % IV SOLN: INTRAVENOUS | Status: DC

## 2019-03-03 MED ORDER — DIPHENHYDRAMINE HCL 12.5 MG/5ML PO ELIX: 12.5000 mg | ORAL_SOLUTION | Freq: Four times a day (QID) | ORAL | Status: DC | PRN

## 2019-03-03 MED ORDER — HYDROMORPHONE HCL 1 MG/ML IJ SOLN
INTRAMUSCULAR | Status: AC
  Filled 2019-03-03: qty 1

## 2019-03-03 MED ORDER — SUCCINYLCHOLINE CHLORIDE 200 MG/10ML IV SOSY
PREFILLED_SYRINGE | INTRAVENOUS | Status: AC
  Filled 2019-03-03: qty 10

## 2019-03-03 MED ORDER — IOHEXOL 300 MG/ML  SOLN
100.0000 mL | Freq: Once | INTRAMUSCULAR | Status: AC | PRN
  Administered 2019-03-03: 100 mL via INTRAVENOUS

## 2019-03-03 MED ORDER — SODIUM CHLORIDE 0.9 % IV SOLN
2.0000 g | Freq: Once | INTRAVENOUS | Status: AC
  Administered 2019-03-03: 19:00:00 2 g via INTRAVENOUS
  Filled 2019-03-03: qty 2

## 2019-03-03 MED ORDER — DIPHENHYDRAMINE HCL 50 MG/ML IJ SOLN: 12.5000 mg | Freq: Four times a day (QID) | INTRAMUSCULAR | Status: DC | PRN

## 2019-03-03 MED ORDER — ONDANSETRON HCL 4 MG/2ML IJ SOLN
4.0000 mg | Freq: Once | INTRAMUSCULAR | Status: AC
  Administered 2019-03-03: 09:00:00 4 mg via INTRAVENOUS
  Filled 2019-03-03: qty 2

## 2019-03-03 MED ORDER — LIDOCAINE HCL 1 % IJ SOLN
INTRAMUSCULAR | Status: DC | PRN
  Administered 2019-03-03: 3.5 mL

## 2019-03-03 MED ORDER — NORELGESTROMIN-ETH ESTRADIOL 150-35 MCG/24HR TD PTWK: 1.0000 | MEDICATED_PATCH | TRANSDERMAL | Status: DC

## 2019-03-03 MED ORDER — PROCHLORPERAZINE MALEATE 10 MG PO TABS
10.0000 mg | ORAL_TABLET | Freq: Four times a day (QID) | ORAL | Status: DC | PRN
  Filled 2019-03-03: qty 1

## 2019-03-03 MED ORDER — ROCURONIUM BROMIDE 10 MG/ML (PF) SYRINGE
PREFILLED_SYRINGE | INTRAVENOUS | Status: AC
  Filled 2019-03-03: qty 10

## 2019-03-03 MED ORDER — MORPHINE SULFATE (PF) 2 MG/ML IV SOLN
1.0000 mg | INTRAVENOUS | Status: DC | PRN
  Administered 2019-03-03: 2 mg via INTRAVENOUS
  Administered 2019-03-03: 18:00:00 1 mg via INTRAVENOUS
  Filled 2019-03-03 (×2): qty 1

## 2019-03-03 MED ORDER — SUCCINYLCHOLINE CHLORIDE 200 MG/10ML IV SOSY
PREFILLED_SYRINGE | INTRAVENOUS | Status: DC | PRN
  Administered 2019-03-03: 140 mg via INTRAVENOUS

## 2019-03-03 MED ORDER — VITAMIN D (ERGOCALCIFEROL) 1.25 MG (50000 UNIT) PO CAPS
50000.0000 [IU] | ORAL_CAPSULE | ORAL | Status: DC
  Administered 2019-03-04: 10:00:00 50000 [IU] via ORAL
  Filled 2019-03-03: qty 1

## 2019-03-03 MED ORDER — ROCURONIUM BROMIDE 100 MG/10ML IV SOLN
INTRAVENOUS | Status: DC | PRN
  Administered 2019-03-03: 50 mg via INTRAVENOUS

## 2019-03-03 MED ORDER — DEXAMETHASONE SODIUM PHOSPHATE 10 MG/ML IJ SOLN
INTRAMUSCULAR | Status: DC | PRN
  Administered 2019-03-03: 10 mg via INTRAVENOUS

## 2019-03-03 MED ORDER — DEXTROSE IN LACTATED RINGERS 5 % IV SOLN: INTRAVENOUS | Status: DC

## 2019-03-03 MED ORDER — ONDANSETRON HCL 4 MG/2ML IJ SOLN
INTRAMUSCULAR | Status: AC
  Filled 2019-03-03: qty 2

## 2019-03-03 MED ORDER — ONDANSETRON HCL 4 MG PO TABS: 4.0000 mg | ORAL_TABLET | Freq: Three times a day (TID) | ORAL | Status: DC | PRN

## 2019-03-03 MED ORDER — METHOCARBAMOL 500 MG PO TABS
500.0000 mg | ORAL_TABLET | Freq: Four times a day (QID) | ORAL | Status: DC | PRN
  Administered 2019-03-03 – 2019-03-04 (×2): 500 mg via ORAL
  Filled 2019-03-03 (×2): qty 1

## 2019-03-03 MED ORDER — HYDROXYCHLOROQUINE SULFATE 200 MG PO TABS
200.0000 mg | ORAL_TABLET | Freq: Every day | ORAL | Status: DC
  Administered 2019-03-03 – 2019-03-04 (×2): 200 mg via ORAL
  Filled 2019-03-03 (×2): qty 1

## 2019-03-03 MED ORDER — METRONIDAZOLE IN NACL 5-0.79 MG/ML-% IV SOLN
500.0000 mg | Freq: Three times a day (TID) | INTRAVENOUS | Status: AC
  Administered 2019-03-03 – 2019-03-04 (×2): 500 mg via INTRAVENOUS
  Filled 2019-03-03 (×2): qty 100

## 2019-03-03 MED ORDER — LIDOCAINE HCL 1 % IJ SOLN
INTRAMUSCULAR | Status: AC
  Filled 2019-03-03: qty 20

## 2019-03-03 MED ORDER — MORPHINE SULFATE (PF) 4 MG/ML IV SOLN
4.0000 mg | Freq: Once | INTRAVENOUS | Status: AC
  Administered 2019-03-03: 4 mg via INTRAVENOUS
  Filled 2019-03-03: qty 1

## 2019-03-03 MED ORDER — PIPERACILLIN-TAZOBACTAM 3.375 G IVPB
3.3750 g | Freq: Three times a day (TID) | INTRAVENOUS | Status: DC
  Administered 2019-03-03 – 2019-03-04 (×2): 3.375 g via INTRAVENOUS
  Filled 2019-03-03 (×2): qty 50

## 2019-03-03 MED ORDER — PROCHLORPERAZINE EDISYLATE 10 MG/2ML IJ SOLN: 5.0000 mg | Freq: Four times a day (QID) | INTRAMUSCULAR | Status: DC | PRN

## 2019-03-03 MED ORDER — MIDAZOLAM HCL 5 MG/5ML IJ SOLN
INTRAMUSCULAR | Status: DC | PRN
  Administered 2019-03-03: 2 mg via INTRAVENOUS

## 2019-03-03 MED ORDER — FLUTICASONE PROPIONATE 50 MCG/ACT NA SUSP: 1.0000 | Freq: Every day | NASAL | Status: DC

## 2019-03-03 MED ORDER — LACTATED RINGERS IV SOLN: INTRAVENOUS | Status: DC

## 2019-03-03 SURGICAL SUPPLY — 43 items
APPLIER CLIP ROT 10 11.4 M/L (STAPLE) ×3
BLADE CLIPPER SURG (BLADE) ×3 IMPLANT
CANISTER SUCT 3000ML PPV (MISCELLANEOUS) ×3 IMPLANT
CHLORAPREP W/TINT 26 (MISCELLANEOUS) ×3 IMPLANT
CLIP APPLIE ROT 10 11.4 M/L (STAPLE) ×1 IMPLANT
COVER SURGICAL LIGHT HANDLE (MISCELLANEOUS) ×3 IMPLANT
CUTTER FLEX LINEAR 45M (STAPLE) ×3 IMPLANT
DERMABOND ADVANCED (GAUZE/BANDAGES/DRESSINGS) ×2
DERMABOND ADVANCED .7 DNX12 (GAUZE/BANDAGES/DRESSINGS) ×1 IMPLANT
DRAPE WARM FLUID 44X44 (DRAPES) IMPLANT
ELECT REM PT RETURN 9FT ADLT (ELECTROSURGICAL) ×3
ELECTRODE REM PT RTRN 9FT ADLT (ELECTROSURGICAL) ×1 IMPLANT
ENDOLOOP SUT PDS II  0 18 (SUTURE)
ENDOLOOP SUT PDS II 0 18 (SUTURE) IMPLANT
GLOVE BIO SURGEON STRL SZ 6 (GLOVE) ×3 IMPLANT
GLOVE INDICATOR 6.5 STRL GRN (GLOVE) ×6 IMPLANT
GOWN STRL REUS W/ TWL LRG LVL3 (GOWN DISPOSABLE) ×1 IMPLANT
GOWN STRL REUS W/TWL 2XL LVL3 (GOWN DISPOSABLE) ×3 IMPLANT
GOWN STRL REUS W/TWL LRG LVL3 (GOWN DISPOSABLE) ×2
KIT BASIN OR (CUSTOM PROCEDURE TRAY) ×3 IMPLANT
KIT TURNOVER KIT B (KITS) ×3 IMPLANT
L-HOOK LAP DISP 36CM (ELECTROSURGICAL) ×3
LHOOK LAP DISP 36CM (ELECTROSURGICAL) ×1 IMPLANT
NS IRRIG 1000ML POUR BTL (IV SOLUTION) ×3 IMPLANT
PAD ARMBOARD 7.5X6 YLW CONV (MISCELLANEOUS) ×6 IMPLANT
PENCIL BUTTON HOLSTER BLD 10FT (ELECTRODE) ×3 IMPLANT
POUCH RETRIEVAL ECOSAC 10 (ENDOMECHANICALS) ×1 IMPLANT
POUCH RETRIEVAL ECOSAC 10MM (ENDOMECHANICALS) ×2
RELOAD STAPLE TA45 3.5 REG BLU (ENDOMECHANICALS) ×3 IMPLANT
SET IRRIG TUBING LAPAROSCOPIC (IRRIGATION / IRRIGATOR) ×3 IMPLANT
SET TUBE SMOKE EVAC HIGH FLOW (TUBING) ×3 IMPLANT
SHEARS HARMONIC ACE PLUS 36CM (ENDOMECHANICALS) ×3 IMPLANT
SLEEVE ENDOPATH XCEL 5M (ENDOMECHANICALS) ×3 IMPLANT
SPECIMEN JAR SMALL (MISCELLANEOUS) ×3 IMPLANT
SUT MNCRL AB 4-0 PS2 18 (SUTURE) ×3 IMPLANT
TOWEL GREEN STERILE (TOWEL DISPOSABLE) ×3 IMPLANT
TOWEL GREEN STERILE FF (TOWEL DISPOSABLE) ×3 IMPLANT
TRAY FOLEY W/BAG SLVR 16FR (SET/KITS/TRAYS/PACK)
TRAY FOLEY W/BAG SLVR 16FR ST (SET/KITS/TRAYS/PACK) IMPLANT
TRAY LAPAROSCOPIC MC (CUSTOM PROCEDURE TRAY) ×3 IMPLANT
TROCAR XCEL BLUNT TIP 100MML (ENDOMECHANICALS) ×3 IMPLANT
TROCAR XCEL NON-BLD 5MMX100MML (ENDOMECHANICALS) ×3 IMPLANT
WATER STERILE IRR 1000ML POUR (IV SOLUTION) ×3 IMPLANT

## 2019-03-03 NOTE — H&P (Signed)
Linda Shelton is an 34 y.o. female.   Chief Complaint: abdominal pain HPI: Pt is a 34 yo F who came to the ED for abdominal pain that woke her from sleep rather suddenly.  She felt pain near her umbilicus and back.  She has had nausea, but no vomiting.  She denies fever/chills.  She is s/p gastric bypass and that has been going well.  She denies diarrhea, but has had some constipation.  She had a positive covid test 12/26.  She feels worse with movement.    She is s/p lap gastric bypass in [place] at [facility].    Past Medical History:  Diagnosis Date  . Anemia   . Hypoglycemia   . Pericarditis   . Pleural effusion associated with pulmonary infection   . RA (rheumatoid arthritis) ([place])     Past Surgical History:  Procedure Laterality Date  . GASTRIC BYPASS    . TUBAL LIGATION      No family history on file. Social History:  reports that she has never smoked. She has never used smokeless tobacco. She reports that she does not drink alcohol or use drugs.  Allergies: No Known Allergies  Scheduled Meds: Current Meds  Medication Sig  . albuterol (VENTOLIN HFA) 108 (90 Base) MCG/ACT inhaler Inhale 1-2 puffs into the lungs every 6 (six) hours as needed for wheezing or shortness of breath.  Marland Kitchen ascorbic acid (VITAMIN C) 1000 MG tablet Take 1,000 mg by mouth daily.   . Cetirizine HCl 10 MG CAPS Take 10 mg by mouth daily as needed (allergies).   . hydroxychloroquine (PLAQUENIL) 200 MG tablet Take 200 mg by mouth daily.   Marland Kitchen KRILL OIL PO Take 1 capsule by mouth daily.  . norelgestromin-ethinyl estradiol (ORTHO EVRA) 150-35 MCG/24HR transdermal patch Place 1 patch onto the skin once a week.  Marland Kitchen omeprazole (PRILOSEC) 20 MG capsule Take 20 mg by mouth daily.   . predniSONE (DELTASONE) 5 MG tablet Take 5 mg by mouth daily with breakfast.   . Prenatal Vit-Fe Fumarate-FA (PRENATAL MULTIVITAMIN) TABS tablet Take 1 tablet by mouth daily at 12 noon.  . Vitamin D, Ergocalciferol, (DRISDOL) 1.25 MG  (50000 UNIT) CAPS capsule Take 50,000 Units by mouth once a week.      Results for orders placed or performed during the hospital encounter of 03/03/19 (from the past 48 hour(s))  Comprehensive metabolic panel     Status: None   Collection Time: 03/03/19  9:01 AM  Result Value Ref Range   Sodium 139 135 - 145 mmol/L   Potassium 3.8 3.5 - 5.1 mmol/L   Chloride 104 98 - 111 mmol/L   CO2 22 22 - 32 mmol/L   Glucose, Bld 76 70 - 99 mg/dL   BUN 9 6 - 20 mg/dL   Creatinine, Ser 0.74 0.44 - 1.00 mg/dL   Calcium 9.0 8.9 - 10.3 mg/dL   Total Protein 7.4 6.5 - 8.1 g/dL   Albumin 4.0 3.5 - 5.0 g/dL   AST 22 15 - 41 U/L   ALT 14 0 - 44 U/L   Alkaline Phosphatase 65 38 - 126 U/L   Total Bilirubin 0.5 0.3 - 1.2 mg/dL   GFR calc non Af Amer >60 >60 mL/min   GFR calc Af Amer >60 >60 mL/min   Anion gap 13 5 - 15    Comment: Performed at [facility], [address] [place],  [address]  CBC with Differential     Status:  Abnormal   Collection Time: 03/03/19  9:01 AM  Result Value Ref Range   WBC 13.6 (H) 4.0 - 10.5 K/uL   RBC 4.21 3.87 - 5.11 MIL/uL   Hemoglobin 12.1 12.0 - 15.0 g/dL   HCT 41.7 40.8 - 14.4 %   MCV 89.8 80.0 - 100.0 fL   MCH 28.7 26.0 - 34.0 pg   MCHC 32.0 30.0 - 36.0 g/dL   RDW 81.8 56.3 - 14.9 %   Platelets 284 150 - 400 K/uL   nRBC 0.0 0.0 - 0.2 %   Neutrophils Relative % 90 %   Neutro Abs 12.2 (H) 1.7 - 7.7 K/uL   Lymphocytes Relative 6 %   Lymphs Abs 0.8 0.7 - 4.0 K/uL   Monocytes Relative 4 %   Monocytes Absolute 0.5 0.1 - 1.0 K/uL   Eosinophils Relative 0 %   Eosinophils Absolute 0.0 0.0 - 0.5 K/uL   Basophils Relative 0 %   Basophils Absolute 0.0 0.0 - 0.1 K/uL   Immature Granulocytes 0 %   Abs Immature Granulocytes 0.06 0.00 - 0.07 K/uL    Comment: Performed at [facility], [address] [place], [place] [address]  Lipase, blood     Status: None   Collection Time: 03/03/19  9:01 AM  Result Value Ref Range   Lipase 25 11 - 51 U/L     Comment: Performed at [facility], [address], [place], [place] [address]  Urinalysis, Routine w reflex microscopic     Status: None   Collection Time: 03/03/19  9:34 AM  Result Value Ref Range   Color, Urine YELLOW YELLOW   APPearance CLEAR CLEAR   Specific Gravity, Urine 1.013 1.005 - 1.030   pH 6.0 5.0 - 8.0   Glucose, UA NEGATIVE NEGATIVE mg/dL   Hgb urine dipstick NEGATIVE NEGATIVE   Bilirubin Urine NEGATIVE NEGATIVE   Ketones, ur NEGATIVE NEGATIVE mg/dL   Protein, ur NEGATIVE NEGATIVE mg/dL   Nitrite NEGATIVE NEGATIVE   Leukocytes,Ua NEGATIVE NEGATIVE    Comment: Performed at [facility], [address] [place], [place] [address]  I-Stat beta hCG blood, ED     Status: None   Collection Time: 03/03/19  9:46 AM  Result Value Ref Range   I-stat hCG, quantitative <5.0 <5 mIU/mL   Comment 3            Comment:   GEST. AGE      CONC.  (mIU/mL)   <=1 WEEK        5 - 50     2 WEEKS       50 - 500     3 WEEKS       100 - 10,000     4 WEEKS     1,000 - 30,000        FEMALE AND NON-PREGNANT FEMALE:     LESS THAN 5 mIU/mL    CT ABDOMEN PELVIS W CONTRAST  Result Date: 03/03/2019 CLINICAL DATA:  34 year old female with acute abdominal and pelvic pain. History of gastric bypass. EXAM: CT ABDOMEN AND PELVIS WITH CONTRAST TECHNIQUE: Multidetector CT imaging of the abdomen and pelvis was performed using the standard protocol following bolus administration of intravenous contrast. CONTRAST:  OMNIPAQUE IOHEXOL 300 MG/ML  SOLN COMPARISON:  12/07/2018 CT FINDINGS: Lower chest: Minimal basilar atelectasis noted. Hepatobiliary: A 2.5 cm ill-defined hypoechoic area within the LOWER RIGHT liver (series 3: Image 35) is identified. The remainder of the  liver and gallbladder are unremarkable. No biliary dilatation. Pancreas: Unremarkable Spleen: Unremarkable Adrenals/Urinary Tract: The kidneys, adrenal glands and bladder are unremarkable. Stomach/Bowel: The mid-distal appendix is  enlarged with mild adjacent inflammation compatible with acute appendicitis. The appendix extends inferomedial from the cecum into the pelvis. There is no evidence of abscess or pneumoperitoneum. Gastric bypass changes are identified. There is no evidence of bowel obstruction. Vascular/Lymphatic: No significant vascular findings are present. No enlarged abdominal or pelvic lymph nodes. Reproductive: Uterus and bilateral adnexa are unremarkable. Other: No ascites. Musculoskeletal: No acute or suspicious bony abnormalities. IMPRESSION: 1. Acute appendicitis. No evidence of abscess or pneumoperitoneum. 2. 2.5 cm indeterminate ill-defined hypoechoic area within the LOWER RIGHT liver. Elective outpatient MRI with and without contrast when able. 3. Gastric bypass changes without complicating features. Electronically Signed   By: Harmon Pier M.D.   On: 03/03/2019 11:22    Review of Systems  Constitutional: Negative.   HENT: Negative.   Eyes: Negative.   Respiratory: Negative.   Cardiovascular: Negative.   Gastrointestinal: Positive for abdominal pain and constipation.  Endocrine: Negative.   Genitourinary: Negative.   Musculoskeletal: Positive for back pain.  Allergic/Immunologic: Negative.   Neurological: Negative.   Hematological: Negative.   Psychiatric/Behavioral: Negative.   All other systems reviewed and are negative.   Blood pressure 114/80, pulse 86, temperature 98.3 F (36.8 C), temperature source Oral, resp. rate 18, SpO2 98 %.   Physical Exam  Constitutional: She is oriented to person, place, and time. She appears well-developed and well-nourished. No distress.  HENT:  Head: Normocephalic and atraumatic.  Right Ear: External ear normal.  Left Ear: External ear normal.  Mouth/Throat: No oropharyngeal exudate.  Eyes: Pupils are equal, round, and reactive to light. Conjunctivae are normal. Right eye exhibits no discharge. Left eye exhibits no discharge. No scleral icterus.  Neck: No  tracheal deviation present. No thyromegaly present.  Cardiovascular: Normal rate and intact distal pulses.  Respiratory: Effort normal. No respiratory distress. She exhibits no tenderness.  GI: Soft. She exhibits no distension and no mass. There is abdominal tenderness (RLQ). There is no rebound and no guarding.  Musculoskeletal:        General: No tenderness, deformity or edema. Normal range of motion.     Cervical back: Normal range of motion and neck supple.  Neurological: She is alert and oriented to person, place, and time. Coordination normal.  Skin: Skin is warm and dry. No rash noted. She is not diaphoretic. No erythema. No pallor.  Psychiatric: She has a normal mood and affect. Her behavior is normal. Judgment and thought content normal.      Assessment/Plan Acute appendicitis S/p gastric bypass  NPO IV fluids IV antibiotics To OR for laparoscopic appendectomy  Will determine post op plan after surgery. May be able to go home from recovery room if not perforated.    Appendectomy was described to the patient.  The incisions and surgical technique were explained.  The patient was advised that some of the hair on the abdomen would be clipped, and that a foley catheter would be placed.  I advised the patient of the risks of surgery including, but not limited to, bleeding, infection, damage to other structures, risk of an open operation, risk of abscess, and risk of blood clot.  The recovery was also described to the patient.  She was advised that he will have lifting restrictions for 2 weeks.    No NSAIDS due to gastric bypass.     Darrick Huntsman  Donell Beers, MD 03/03/2019, 2:35 PM

## 2019-03-03 NOTE — Op Note (Signed)
Laparoscopic appendectomy  Indications: The patient presented with a history of right-sided abdominal pain. A CT revealed findings consistent with acute appendicitis.  Pre-operative Diagnosis: acute appendicitis  Post-operative Diagnosis: Same  Surgeon: Almond Lint   Assistants: n/a  Anesthesia: General endotracheal anesthesia and Local anesthesia 1% plain lidocaine, 0.25.% bupivacaine  ASA Class: 2  Procedure Details  The patient was seen again in the Holding Room. The risks, benefits, complications, treatment options, and expected outcomes were discussed with the patient and/or family. The possibilities of perforation of viscus, bleeding, recurrent infection, the need for additional procedures, failure to diagnose a condition, and creating a complication requiring transfusion or operation were discussed. There was concurrence with the proposed plan and informed consent was obtained. The site of surgery was properly noted. The patient was taken to Operating Room, identified as Linda Shelton and the procedure verified as Appendectomy. A Time Out was held and the above information confirmed.  The patient was placed in the supine position and general anesthesia was induced, along with placement of orogastric tube, Venodyne boots, and a Foley catheter. The abdomen was prepped and draped in a sterile fashion. Local anesthetic was infiltrated in the infraumbilical region.  A 1.5 cm vertical incision was made just beside the umbilicus in a prior incision.  The Kelly clamp was used to spread the subcutaneous tissues.  The fascia was elevated with 2 Kocher clamps and incised with the #11 blade.  A Tresa Endo was used to confirm entrance into the peritoneal cavity.  A pursestring suture was placed around the fascial incision.  The Hasson trocar was inserted into the abdomen and held in place with the tails of the suture.  The pneumoperitoneum was then established to steady pressure of 15 mmHg.     Additional  5 mm cannulas then placed in the left lower quadrant of the abdomen and the suprapubic region under direct visualization.  A careful evaluation of the entire abdomen was carried out. The patient was placed in Trendelenburg and rotated to the left.  The small intestines were retracted in the cephalad and left lateral direction away from the pelvis and right lower quadrant. The patient was found to have an enlarged and inflamed appendix in the pelvic position. There was no evidence of perforation.  The appendix was carefully dissected. The appendix was was skeletonized with the harmonic scalpel.   The appendix was divided at its base using an endo-GIA stapler. Minimal appendiceal stump was left in place. The appendix was removed from the abdomen with an Endocatch bag through the umbilical port.  There was no evidence of bleeding, leakage, or complication after division of the appendix. Irrigation was also performed and irrigate suctioned from the abdomen as well.  The 5 mm trocars were removed.  The pneumoperitoneum was evacuated from the abdomen.    The trocar site skin wounds were closed with 4-0 Monocryl and dressed with Dermabond.  Instrument, sponge, and needle counts were correct at the conclusion of the case.   Findings: The appendix was found to be inflamed. There were not signs of necrosis.  There was not perforation. There was not abscess formation.  Estimated Blood Loss:  Minimal         Drains: none          Specimens: appendix to pathology         Complications:  None; patient tolerated the procedure well.         Disposition: PACU - hemodynamically stable.  Condition: stable

## 2019-03-03 NOTE — ED Triage Notes (Addendum)
Pt reports recently being treated for "overgrowth of bacteria" in stomach, reports "excrutiating" pain that began early this morning. Hx of gastric bypass a few years ago.

## 2019-03-03 NOTE — ED Provider Notes (Signed)
[facility] EMERGENCY DEPARTMENT Provider Note    CSN: [id number] Arrival date & time: 03/03/19  0744     History Chief Complaint  Patient presents with   Abdominal Pain    Linda Shelton is a 34 y.o. female with a history of gastric bypass, tubal ligation, hypoglycemia, presenting to the emergency department acute onset abdominal pain.  She reports that she woke up from sleep with pain in her mid abdomen and lower to mid back around approximately 4 AM this morning.  She says the pain was sudden onset.  She says she has never had pain like this before.  She describes a very sharp pain in her mid abdomen near the epigastrium and also the umbilicus.  She also feels pain in her back.  She reports nausea.  She reports difficulty with urination.  She denies any vomiting.  She denies any fevers or chills.  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  She said this was based on a "breath test that I did."  Per care everywhere records from [facility].  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  She also is a history of bloating, flatulence and gas pain.  She had an EGD done on October 04, 2018 at [facility] which showed a small gastric pouch was otherwise normal.  She had a lab work-up for hyperinsulinemia which was unremarkable per her weight management office note by Dr Haynes Hoehn on 11/26/2018.  She does also follow with endocrinology.  She last had a CT abdomen pelvis in November 2020.  The radiologist report shows no acute abnormalities or findings.  There were no gallstones.  No kidney stones.  The stomach appeared normal at that time.  There were no significant vascular findings.    HPI     Past Medical History:  Diagnosis Date   Anemia    Hypoglycemia     Pericarditis    Pleural effusion associated with pulmonary infection    RA (rheumatoid arthritis) (HCC)     Patient Active Problem List   Diagnosis Date Noted   Acute appendicitis 03/03/2019    Past Surgical History:  Procedure Laterality Date   GASTRIC BYPASS     TUBAL LIGATION       OB History   No obstetric history on file.     History reviewed. No pertinent family history.  Social History   Tobacco Use   Smoking status: Never Smoker   Smokeless tobacco: Never Used  Substance Use Topics   Alcohol use: No   Drug use: No    Home Medications Prior to Admission medications   Medication Sig Start Date End Date Taking? Authorizing Provider  albuterol (VENTOLIN HFA) 108 (90 Base) MCG/ACT inhaler Inhale 1-2 puffs into the lungs every 6 (six) hours as needed for wheezing or shortness of breath. 01/19/19  Yes Fondaw, Wylder S, PA  ascorbic acid (VITAMIN C) 1000 MG tablet Take 1,000 mg by mouth daily.    Yes [provider]  Cetirizine HCl 10 MG CAPS Take 10 mg by mouth daily as needed (allergies).    Yes [provider]  hydroxychloroquine (PLAQUENIL) 200 MG tablet Take 200 mg by mouth daily.  10/08/18  Yes [provider]  KRILL OIL PO Take 1  capsule by mouth daily.   Yes [provider]  norelgestromin-ethinyl estradiol (ORTHO EVRA) 150-35 MCG/24HR transdermal patch Place 1 patch onto the skin once a week. 01/31/19  Yes [provider]  omeprazole (PRILOSEC) 20 MG capsule Take 20 mg by mouth daily.  08/15/18  Yes [provider]  predniSONE (DELTASONE) 5 MG tablet Take 5 mg by mouth daily with breakfast.  10/08/18  Yes [provider]  Prenatal Vit-Fe Fumarate-FA (PRENATAL MULTIVITAMIN) TABS tablet Take 1 tablet by mouth daily at 12 noon.   Yes [provider]  Vitamin D, Ergocalciferol, (DRISDOL) 1.25 MG (50000 UNIT) CAPS capsule Take 50,000 Units by mouth once a week. 09/10/18  Yes [provider]  benzonatate (TESSALON) 100 MG capsule Take 1 capsule (100 mg total) by mouth every 8 (eight) hours. Patient not taking: Reported on 03/03/2019 01/19/19   Gailen Shelter, PA  cetirizine-pseudoephedrine (ZYRTEC-D) 5-120 MG tablet Take 1 tablet by mouth daily. Patient not taking: Reported on 03/03/2019 01/19/19   Gailen Shelter, PA  fluticasone (FLONASE) 50 MCG/ACT nasal spray Place 1 spray into both nostrils daily. Patient not taking: Reported on 03/03/2019 01/19/19   Gailen Shelter, PA  ondansetron (ZOFRAN) 4 MG tablet Take 1 tablet (4 mg total) by mouth every 8 (eight) hours as needed for nausea or vomiting. Patient not taking: Reported on 03/03/2019 04/11/17   Tegeler, Canary Brim, MD  oxyCODONE-acetaminophen (PERCOCET/ROXICET) 5-325 MG tablet Take 1 tablet by mouth every 4 (four) hours as needed for severe pain. Patient not taking: Reported on 03/03/2019 04/11/17   Tegeler, Canary Brim, MD    Allergies    Patient has no known allergies.  Review of Systems   Review of Systems  Constitutional: Negative for chills and fever.  Respiratory: Negative for cough and shortness of breath.   Cardiovascular: Negative for chest pain and palpitations.  Gastrointestinal: Positive for abdominal pain and nausea. Negative for abdominal distention and vomiting.  Genitourinary: Positive for flank pain. Negative for dysuria and hematuria.  Skin: Negative for pallor and rash.  Neurological: Negative for syncope and light-headedness.  All other systems reviewed and are negative.   Physical Exam Updated Vital Signs BP 102/62 (BP Location: Left Arm)    Pulse 75    Temp 99.3 F (37.4 C) (Oral)    Resp 16    SpO2 100%   Physical Exam Vitals and nursing note reviewed.  Constitutional:      Appearance: She is well-developed.  HENT:     Head: Normocephalic and atraumatic.  Eyes:     Conjunctiva/sclera: Conjunctivae normal.  Cardiovascular:     Rate and Rhythm: Normal rate and regular  rhythm.     Heart sounds: No murmur.  Pulmonary:     Effort: Pulmonary effort is normal. No respiratory distress.     Breath sounds: Normal breath sounds.  Abdominal:     Palpations: Abdomen is soft.     Tenderness: There is abdominal tenderness in the epigastric area. There is no guarding or rebound. Negative signs include Murphy's sign and McBurney's sign.     Comments: Mild generalized tenderness and bilateral lower back tenderness  Musculoskeletal:     Cervical back: Neck supple.  Skin:    General: Skin is warm and dry.  Neurological:     General: No focal deficit present.     Mental Status: She is alert and oriented to person, place, and time.     ED Results / Procedures / Treatments  Labs (all labs ordered are listed, but only abnormal results are displayed) Labs Reviewed  CBC WITH DIFFERENTIAL/PLATELET - Abnormal; Notable for the following components:      Result Value   WBC 13.6 (*)    Neutro Abs 12.2 (*)    All other components within normal limits  COMPREHENSIVE METABOLIC PANEL  LIPASE, BLOOD  URINALYSIS, ROUTINE W REFLEX MICROSCOPIC  GLUCOSE, CAPILLARY  GLUCOSE, CAPILLARY  BASIC METABOLIC PANEL  CBC  MAGNESIUM  PHOSPHORUS  I-STAT BETA HCG BLOOD, ED ([facility], [facility], [facility] ONLY)  SURGICAL PATHOLOGY    EKG None  Radiology CT ABDOMEN PELVIS W CONTRAST  Result Date: 03/03/2019 CLINICAL DATA:  34 year old female with acute abdominal and pelvic pain. History of gastric bypass. EXAM: CT ABDOMEN AND PELVIS WITH CONTRAST TECHNIQUE: Multidetector CT imaging of the abdomen and pelvis was performed using the standard protocol following bolus administration of intravenous contrast. CONTRAST:  178mL OMNIPAQUE IOHEXOL 300 MG/ML  SOLN COMPARISON:  12/07/2018 CT FINDINGS: Lower chest: Minimal basilar atelectasis noted. Hepatobiliary: A 2.5 cm ill-defined hypoechoic area within the LOWER RIGHT liver (series 3: Image 35) is identified. The remainder of the liver and gallbladder are  unremarkable. No biliary dilatation. Pancreas: Unremarkable Spleen: Unremarkable Adrenals/Urinary Tract: The kidneys, adrenal glands and bladder are unremarkable. Stomach/Bowel: The mid-distal appendix is enlarged with mild adjacent inflammation compatible with acute appendicitis. The appendix extends inferomedial from the cecum into the pelvis. There is no evidence of abscess or pneumoperitoneum. Gastric bypass changes are identified. There is no evidence of bowel obstruction. Vascular/Lymphatic: No significant vascular findings are present. No enlarged abdominal or pelvic lymph nodes. Reproductive: Uterus and bilateral adnexa are unremarkable. Other: No ascites. Musculoskeletal: No acute or suspicious bony abnormalities. IMPRESSION: 1. Acute appendicitis. No evidence of abscess or pneumoperitoneum. 2. 2.5 cm indeterminate ill-defined hypoechoic area within the LOWER RIGHT liver. Elective outpatient MRI with and without contrast when able. 3. Gastric bypass changes without complicating features. Electronically Signed   By: Margarette Canada M.D.   On: 03/03/2019 11:22    Procedures Procedures (including critical care time)  Medications Ordered in ED Medications  piperacillin-tazobactam (ZOSYN) IVPB 3.375 g ( Intravenous MAR Unhold 03/03/19 1720)  lactated ringers infusion ( Intravenous New Bag/Given 03/03/19 1613)  ondansetron (ZOFRAN) tablet 4 mg (has no administration in time range)  loratadine (CLARITIN) tablet 10 mg (has no administration in time range)  fluticasone (FLONASE) 50 MCG/ACT nasal spray 1 spray (has no administration in time range)  albuterol (VENTOLIN HFA) 108 (90 Base) MCG/ACT inhaler 1-2 puff (has no administration in time range)  ascorbic acid (VITAMIN C) tablet 1,000 mg (has no administration in time range)  hydroxychloroquine (PLAQUENIL) tablet 200 mg (has no administration in time range)  norelgestromin-ethinyl estradiol (ORTHO EVRA) 150-35 MCG/24HR transdermal patch 1 patch (has no  administration in time range)  pantoprazole (PROTONIX) EC tablet 40 mg (has no administration in time range)  predniSONE (DELTASONE) tablet 5 mg (has no administration in time range)  prenatal multivitamin tablet 1 tablet (has no administration in time range)  Vitamin D (Ergocalciferol) (DRISDOL) capsule 50,000 Units (has no administration in time range)  enoxaparin (LOVENOX) injection 40 mg (has no administration in time range)  dextrose 5 % in lactated ringers infusion (has no administration in time range)  cefTRIAXone (ROCEPHIN) 2 g in sodium chloride 0.9 % 100 mL IVPB (has no administration in time range)    And  metroNIDAZOLE (FLAGYL) IVPB 500 mg (has no administration in time range)  acetaminophen (TYLENOL) tablet 676-195  mg (has no administration in time range)    Or  acetaminophen (TYLENOL) suppository 650 mg (has no administration in time range)  traMADol (ULTRAM) tablet 50 mg (has no administration in time range)  oxyCODONE (Oxy IR/ROXICODONE) immediate release tablet 5-10 mg (has no administration in time range)  morphine 2 MG/ML injection 1-2 mg (has no administration in time range)  methocarbamol (ROBAXIN) tablet 500 mg (has no administration in time range)  diphenhydrAMINE (BENADRYL) 12.5 MG/5ML elixir 12.5 mg (has no administration in time range)    Or  diphenhydrAMINE (BENADRYL) injection 12.5 mg (has no administration in time range)  senna (SENOKOT) tablet 8.6 mg (has no administration in time range)  prochlorperazine (COMPAZINE) tablet 10 mg (has no administration in time range)    Or  prochlorperazine (COMPAZINE) injection 5-10 mg (has no administration in time range)  sodium chloride 0.9 % bolus 1,000 mL (0 mLs Intravenous Stopped 03/03/19 0934)  morphine 4 MG/ML injection 4 mg (4 mg Intravenous Given 03/03/19 0832)  ondansetron (ZOFRAN) injection 4 mg (4 mg Intravenous Given 03/03/19 0832)  HYDROmorphone (DILAUDID) injection 1 mg (1 mg Intravenous Given 03/03/19 1117)    iohexol (OMNIPAQUE) 300 MG/ML solution 100 mL (100 mLs Intravenous Contrast Given 03/03/19 1049)  piperacillin-tazobactam (ZOSYN) IVPB 3.375 g (0 g Intravenous Stopped 03/03/19 1223)  HYDROmorphone (DILAUDID) injection 1 mg (1 mg Intravenous Given 03/03/19 1342)    ED Course  I have reviewed the triage vital signs and the nursing notes.  Pertinent labs & imaging results that were available during my care of the patient were reviewed by me and considered in my medical decision making (see chart for details).  34 year old female presented emergency department abrupt onset epigastric and lower abdominal pain, also reporting pain in her flanks.  Differential includes diverticulitis versus colitis stress gastritis versus pancreatitis versus cystitis versus pyelonephritis  Less likely appendicitis no focal right lower quadrant abdominal tenderness.  Likewise no focal right upper quadrant tenderness to suggest acute cholecystitis.  She also had a CT scan done 3 months ago which did not show gallstones or gallbladder disease at the time.  Abdominal perforation would be less likely so long after her surgery (and 3 months after her EGD), and she does not present with an acute abdomen on clinical exam.  Plan to check CMP, CBC, Lipase, UA, istat Preg Give IVF, zofran, morphine Reassess  Clinical Course as of Mar 03 1739  Wynelle Link Mar 03, 2019  1149 Likely appendicitis, NPO, pain better controlled, zosyn ordered.  Gen surg paged   [MT]  1302 No callback from surg, will repage   [MT]  1317 Gave phone consult to Dr Merceda Elks who is in the OR   [MT]    Clinical Course User Index [MT] Mohammedali Bedoy, Kermit Balo, MD   Final Clinical Impression(s) / ED Diagnoses Final diagnoses:  Acute appendicitis with localized peritonitis, without perforation, abscess, or gangrene    Rx / DC Orders ED Discharge Orders    None       Terald Sleeper, MD 03/03/19 [id number]

## 2019-03-03 NOTE — Anesthesia Preprocedure Evaluation (Signed)
Anesthesia Evaluation  Patient identified by MRN, date of birth, ID band Patient awake    Reviewed: Allergy & Precautions, NPO status , Patient's Chart, lab work & pertinent test results  Airway Mallampati: II  TM Distance: >3 FB     Dental   Pulmonary    breath sounds clear to auscultation       Cardiovascular  Rhythm:Regular Rate:Normal     Neuro/Psych    GI/Hepatic Neg liver ROS, History noted CG   Endo/Other  negative endocrine ROS  Renal/GU negative Renal ROS     Musculoskeletal  (+) Arthritis ,   Abdominal   Peds  Hematology  (+) anemia ,   Anesthesia Other Findings   Reproductive/Obstetrics                             Anesthesia Physical Anesthesia Plan  ASA: II  Anesthesia Plan: General   Post-op Pain Management:    Induction:   PONV Risk Score and Plan: 3 and Ondansetron, Dexamethasone and Midazolam  Airway Management Planned: Oral ETT  Additional Equipment:   Intra-op Plan:   Post-operative Plan: Extubation in OR  Informed Consent: I have reviewed the patients History and Physical, chart, labs and discussed the procedure including the risks, benefits and alternatives for the proposed anesthesia with the patient or authorized representative who has indicated his/her understanding and acceptance.     Dental advisory given  Plan Discussed with: CRNA and Anesthesiologist  Anesthesia Plan Comments:         Anesthesia Quick Evaluation

## 2019-03-03 NOTE — Progress Notes (Signed)
Received patient from PACU. Patient alert and oriented. Dressing clean, dry, and intact. Patient oriented to call bell and bed controls. Instructed patient not to self ambulate and to utilize call bell for assistance. Will continue to monitor.  

## 2019-03-03 NOTE — Transfer of Care (Signed)
Immediate Anesthesia Transfer of Care Note  Patient: [name]  Procedure(s) Performed: APPENDECTOMY LAPAROSCOPIC (N/A Abdomen)  Patient Location: PACU  Anesthesia Type:General  Level of Consciousness: drowsy  Airway & Oxygen Therapy: Patient Spontanous Breathing and Patient connected to face mask oxygen  Post-op Assessment: Report given to RN and Post -op Vital signs reviewed and stable  Post vital signs: Reviewed and stable  Last Vitals:  Vitals Value Taken Time  BP 104/62 03/03/19 1627  Temp    Pulse 100 03/03/19 1628  Resp 14 03/03/19 1628  SpO2 100 % 03/03/19 1628  Vitals shown include unvalidated device data.  Last Pain:  Vitals:   03/03/19 1433  TempSrc: Oral  PainSc:          Complications: No apparent anesthesia complications

## 2019-03-03 NOTE — ED Notes (Signed)
Pt transported to CT ?

## 2019-03-03 NOTE — Progress Notes (Signed)
Pharmacy Antibiotic Note  Kareem Aul is a 34 y.o. female admitted on 03/03/2019 with appendicitis.  Pharmacy has been consulted for Zosyn dosing.  Plan: Zosyn 3.375g IV every 8 hours (extended infusion) Monitor renal function, intra-abdominal work up, LOT     Temp (24hrs), Avg:98.1 F (36.7 C), Min:98.1 F (36.7 C), Max:98.1 F (36.7 C)  Recent Labs  Lab 03/03/19 0901  WBC 13.6*  CREATININE 0.74    CrCl cannot be calculated (Unknown ideal weight.).    No Known Allergies  Daylene Posey, PharmD Clinical Pharmacist Please check AMION for all [facility] Pharmacy numbers 03/03/2019 11:36 AM

## 2019-03-03 NOTE — Anesthesia Procedure Notes (Signed)
Procedure Name: Intubation Date/Time: 03/03/2019 3:35 PM Performed by: Candis Shine, CRNA Pre-anesthesia Checklist: Patient identified, Emergency Drugs available, Suction available and Patient being monitored Patient Re-evaluated:Patient Re-evaluated prior to induction Oxygen Delivery Method: Circle System Utilized Preoxygenation: Pre-oxygenation with 100% oxygen Induction Type: IV induction, Rapid sequence and Cricoid Pressure applied Laryngoscope Size: Mac and 3 Grade View: Grade I Tube type: Oral Tube size: 7.0 mm Number of attempts: 1 Airway Equipment and Method: Stylet Placement Confirmation: ETT inserted through vocal cords under direct vision,  positive ETCO2 and breath sounds checked- equal and bilateral Secured at: 22 cm Tube secured with: Tape Dental Injury: Teeth and Oropharynx as per pre-operative assessment

## 2019-03-03 NOTE — ED Notes (Signed)
Phlebotomy at bedside.

## 2019-03-03 NOTE — Anesthesia Postprocedure Evaluation (Signed)
Anesthesia Post Note  Patient: Linda Shelton  Procedure(s) Performed: APPENDECTOMY LAPAROSCOPIC (N/A Abdomen)     Patient location during evaluation: PACU Anesthesia Type: General Level of consciousness: awake Pain management: pain level controlled Vital Signs Assessment: post-procedure vital signs reviewed and stable Respiratory status: spontaneous breathing Cardiovascular status: stable Postop Assessment: no apparent nausea or vomiting Anesthetic complications: no    Last Vitals:  Vitals:   03/03/19 1640 03/03/19 1655  BP: 128/76 122/70  Pulse: 94 78  Resp: 14 16  Temp:  37.3 C  SpO2: 100% 100%    Last Pain:  Vitals:   03/03/19 1655  TempSrc:   PainSc: Asleep                 Tzipora Mcinroy

## 2019-03-04 LAB — CBC
HCT: 32.2 % — ABNORMAL LOW (ref 36.0–46.0)
Hemoglobin: 11 g/dL — ABNORMAL LOW (ref 12.0–15.0)
MCH: 29.1 pg (ref 26.0–34.0)
MCHC: 34.2 g/dL (ref 30.0–36.0)
MCV: 85.2 fL (ref 80.0–100.0)
Platelets: 272 10*3/uL (ref 150–400)
RBC: 3.78 MIL/uL — ABNORMAL LOW (ref 3.87–5.11)
RDW: 13.8 % (ref 11.5–15.5)
WBC: 14.3 10*3/uL — ABNORMAL HIGH (ref 4.0–10.5)
nRBC: 0 % (ref 0.0–0.2)

## 2019-03-04 LAB — BASIC METABOLIC PANEL
Anion gap: 13 (ref 5–15)
BUN: <5 mg/dL — ABNORMAL LOW (ref 6–20)
CO2: 24 mmol/L (ref 22–32)
Calcium: 9.2 mg/dL (ref 8.9–10.3)
Chloride: 101 mmol/L (ref 98–111)
Creatinine, Ser: 0.73 mg/dL (ref 0.44–1.00)
GFR calc Af Amer: >60 mL/min (ref >60)
GFR calc non Af Amer: >60 mL/min (ref >60)
Glucose, Bld: 137 mg/dL — ABNORMAL HIGH (ref 70–99)
Potassium: 3.8 mmol/L (ref 3.5–5.1)
Sodium: 138 mmol/L (ref 135–145)

## 2019-03-04 LAB — GLUCOSE, CAPILLARY: Glucose-Capillary: 119 mg/dL — ABNORMAL HIGH (ref 70–99)

## 2019-03-04 LAB — PHOSPHORUS: Phosphorus: 3.2 mg/dL (ref 2.5–4.6)

## 2019-03-04 LAB — MAGNESIUM: Magnesium: 1.7 mg/dL (ref 1.7–2.4)

## 2019-03-04 MED ORDER — OXYCODONE HCL 5 MG PO TABS: 5.0000 mg | ORAL_TABLET | Freq: Four times a day (QID) | ORAL | 0 refills | Status: DC | PRN

## 2019-03-04 MED ORDER — ACETAMINOPHEN 325 MG PO TABS: 650.0000 mg | ORAL_TABLET | Freq: Four times a day (QID) | ORAL | Status: DC | PRN

## 2019-03-04 MED ORDER — ALUM & MAG HYDROXIDE-SIMETH 200-200-20 MG/5ML PO SUSP
30.0000 mL | Freq: Four times a day (QID) | ORAL | Status: DC | PRN
  Administered 2019-03-04: 06:00:00 30 mL via ORAL
  Filled 2019-03-04: qty 30

## 2019-03-04 NOTE — Discharge Instructions (Signed)
[facility] LAPAROSCOPIC SURGERY: POST OP INSTRUCTIONS Always review your discharge instruction sheet given to you by the facility where your surgery was performed. IF YOU HAVE DISABILITY OR FAMILY LEAVE FORMS, YOU MUST BRING THEM TO THE OFFICE FOR PROCESSING.   DO NOT GIVE THEM TO YOUR DOCTOR.  PAIN CONTROL  1. First take acetaminophen (Tylenol) AND/or ibuprofen (Advil) to control your pain after surgery.  Follow directions on package.  Taking acetaminophen (Tylenol) and/or ibuprofen (Advil) regularly after surgery will help to control your pain and lower the amount of prescription pain medication you may need.  You should not take more than 3,000 mg (3 grams) of acetaminophen (Tylenol) in 24 hours.  You should not take ibuprofen (Advil), aleve, motrin, naprosyn or other NSAIDS if you have a history of stomach ulcers or chronic kidney disease.  2. A prescription for pain medication may be given to you upon discharge.  Take your pain medication as prescribed, if you still have uncontrolled pain after taking acetaminophen (Tylenol) or ibuprofen (Advil). 3. Use ice packs to help control pain. 4. If you need a refill on your pain medication, please contact your pharmacy.  They will contact our office to request authorization. Prescriptions will not be filled after 5pm or on week-ends.  HOME MEDICATIONS 5. Take your usually prescribed medications unless otherwise directed.  DIET 6. You should follow a light diet the first few days after arrival home.  Be sure to include lots of fluids daily. Avoid fatty, fried foods.   CONSTIPATION 7. It is common to experience some constipation after surgery and if you are taking pain medication.  Increasing fluid intake and taking a stool softener (such as Colace) will usually help or prevent this problem from occurring.  A mild laxative (Milk of Magnesia or Miralax) should be taken according to package instructions if there are no bowel  movements after 48 hours.  WOUND/INCISION CARE 8. Most patients will experience some swelling and bruising in the area of the incisions.  Ice packs will help.  Swelling and bruising can take several days to resolve.  9. Unless discharge instructions indicate otherwise, follow guidelines below  a. STERI-STRIPS - you may remove your outer bandages 48 hours after surgery, and you may shower at that time.  You have steri-strips (small skin tapes) in place directly over the incision.  These strips should be left on the skin for 7-10 days.   b. DERMABOND/SKIN GLUE - you may shower in 24 hours.  The glue will flake off over the next 2-3 weeks. 10. Any sutures or staples will be removed at the office during your follow-up visit.  ACTIVITIES 11. You may resume regular (light) daily activities beginning the next day--such as daily self-care, walking, climbing stairs--gradually increasing activities as tolerated.  You may have sexual intercourse when it is comfortable.  Refrain from any heavy lifting or straining until approved by your doctor. a. You may drive when you are no longer taking prescription pain medication, you can comfortably wear a seatbelt, and you can safely maneuver your car and apply brakes.  FOLLOW-UP 12. You should see your doctor in the office for a follow-up appointment approximately 2-3 weeks after your surgery.  You should have been given your post-op/follow-up appointment when your surgery was scheduled.  If you did not receive a post-op/follow-up appointment, make sure that you call for this appointment within a day or two after you arrive home to insure a convenient appointment time.  WHEN   TO CALL YOUR DOCTOR: 1. Fever over 101.0 2. Inability to urinate 3. Continued bleeding from incision. 4. Increased pain, redness, or drainage from the incision. 5. Increasing abdominal pain  The clinic staff is available to answer your questions during regular business hours.  Please don't  hesitate to call and ask to speak to one of the nurses for clinical concerns.  If you have a medical emergency, go to the nearest emergency room or call 911.  A surgeon from [facility] is always on call at the hospital. [address], [address], [place], [place]  [address] ? [address] [address], [place], [place]   [address] [phone] ? [phone] ? FAX [phone] Web site: [email or web address]  

## 2019-03-04 NOTE — Progress Notes (Signed)
RN discharge patient home. RN reviewed discharge paperwork, follow up appointments, medication list, and surgery site/wound care. All questions answered. Pt understands without assistance, pt declined interpreter, stating that English is her primary language. Pt waiting for ride, informed pt to press nurse bell when ride is outside.

## 2019-03-04 NOTE — Discharge Summary (Signed)
[facility] Discharge Summary   Patient ID: Linda Shelton MRN: [id number] DOB/AGE: [birth year] 34 y.o.  Admit date: 03/03/2019 Discharge date: 03/04/2019  Admitting Diagnosis: Acute appendicitis  Discharge Diagnosis Patient Active Problem List   Diagnosis Date Noted  . Acute appendicitis 03/03/2019    Consultants None  Imaging: CT ABDOMEN PELVIS W CONTRAST  Result Date: 03/03/2019 CLINICAL DATA:  34 year old female with acute abdominal and pelvic pain. History of gastric bypass. EXAM: CT ABDOMEN AND PELVIS WITH CONTRAST TECHNIQUE: Multidetector CT imaging of the abdomen and pelvis was performed using the standard protocol following bolus administration of intravenous contrast. CONTRAST:  192mL OMNIPAQUE IOHEXOL 300 MG/ML  SOLN COMPARISON:  12/07/2018 CT FINDINGS: Lower chest: Minimal basilar atelectasis noted. Hepatobiliary: A 2.5 cm ill-defined hypoechoic area within the LOWER RIGHT liver (series 3: Image 35) is identified. The remainder of the liver and gallbladder are unremarkable. No biliary dilatation. Pancreas: Unremarkable Spleen: Unremarkable Adrenals/Urinary Tract: The kidneys, adrenal glands and bladder are unremarkable. Stomach/Bowel: The mid-distal appendix is enlarged with mild adjacent inflammation compatible with acute appendicitis. The appendix extends inferomedial from the cecum into the pelvis. There is no evidence of abscess or pneumoperitoneum. Gastric bypass changes are identified. There is no evidence of bowel obstruction. Vascular/Lymphatic: No significant vascular findings are present. No enlarged abdominal or pelvic lymph nodes. Reproductive: Uterus and bilateral adnexa are unremarkable. Other: No ascites. Musculoskeletal: No acute or suspicious bony abnormalities. IMPRESSION: 1. Acute appendicitis. No evidence of abscess or pneumoperitoneum. 2. 2.5 cm indeterminate ill-defined hypoechoic area within the LOWER RIGHT liver. Elective outpatient MRI with and  without contrast when able. 3. Gastric bypass changes without complicating features. Electronically Signed   By: Margarette Canada M.D.   On: 03/03/2019 11:22    Procedures Dr. Barry Dienes (03/03/19) - Laparoscopic Appendectomy  Hospital Course:  Patient is a 34 year old female who presented to [facility] with abdominal pain.  Workup showed acute appendicitis.  Patient was admitted and underwent procedure listed above.  Tolerated procedure well and was transferred to the floor.  Diet was advanced as tolerated.  On POD#1, the patient was voiding well, tolerating diet, ambulating well, pain well controlled, vital signs stable, incisions c/d/i and felt stable for discharge home.  Patient will follow up with our office in 3-4 weeks and knows to call with questions or concerns. She will call to confirm appointment date/time.    Physical Exam: General:  Alert, NAD, pleasant, comfortable Abd:  Soft, ND, mild tenderness, incisions C/D/I  I have personally looked this patient up in the Controlled Substance Database and reviewed their medications.   Allergies as of 03/04/2019   No Known Allergies     Medication List    STOP taking these medications   benzonatate 100 MG capsule Commonly known as: TESSALON   cetirizine-pseudoephedrine 5-120 MG tablet Commonly known as: ZYRTEC-D   fluticasone 50 MCG/ACT nasal spray Commonly known as: FLONASE   ondansetron 4 MG tablet Commonly known as: ZOFRAN   oxyCODONE-acetaminophen 5-325 MG tablet Commonly known as: PERCOCET/ROXICET     TAKE these medications   acetaminophen 325 MG tablet Commonly known as: TYLENOL Take 2-3 tablets (650-975 mg total) by mouth every 6 (six) hours as needed for mild pain, moderate pain, fever or headache (or Fever >/= 101).   albuterol 108 (90 Base) MCG/ACT inhaler Commonly known as: VENTOLIN HFA Inhale 1-2 puffs into the lungs every 6 (six) hours as needed for wheezing or shortness of breath.   ascorbic acid 1000 MG tablet Commonly  known as: VITAMIN C Take 1,000 mg by mouth daily.   Cetirizine HCl 10 MG Caps Take 10 mg by mouth daily as needed (allergies).   hydroxychloroquine 200 MG tablet Commonly known as: PLAQUENIL Take 200 mg by mouth daily.   KRILL OIL PO Take 1 capsule by mouth daily.   norelgestromin-ethinyl estradiol 150-35 MCG/24HR transdermal patch Commonly known as: ORTHO EVRA Place 1 patch onto the skin once a week.   omeprazole 20 MG capsule Commonly known as: PRILOSEC Take 20 mg by mouth daily.   oxyCODONE 5 MG immediate release tablet Commonly known as: Oxy IR/ROXICODONE Take 1 tablet (5 mg total) by mouth every 6 (six) hours as needed for moderate pain.   predniSONE 5 MG tablet Commonly known as: DELTASONE Take 5 mg by mouth daily with breakfast.   prenatal multivitamin Tabs tablet Take 1 tablet by mouth daily at 12 noon.   Vitamin D (Ergocalciferol) 1.25 MG (50000 UNIT) Caps capsule Commonly known as: DRISDOL Take 50,000 Units by mouth once a week.        Follow-up Information    [facility], [facility]. Call in 3 week(s).   Specialty: General Surgery Why: A provider will call you during scheduled appointment time. Please send a photo of incisions to [email or web address] with name and DOB prior to appointment.  Contact information: [address] [place] [address] [phone]           Signed: Wells Guiles, [facility] 03/04/2019, 8:16 AM Please see Amion for pager number during day hours 7:00am-4:30pm

## 2019-03-05 LAB — SURGICAL PATHOLOGY

## 2019-07-18 ENCOUNTER — Other Ambulatory Visit: Payer: Self-pay

## 2019-07-18 ENCOUNTER — Emergency Department (HOSPITAL_COMMUNITY)
Admission: EM | Admit: 2019-07-18 | Discharge: 2019-07-19 | Disposition: A | Discharge status: Home / Self Care | Payer: Medicaid Other | Attending: Emergency Medicine | Admitting: Emergency Medicine

## 2019-07-18 ENCOUNTER — Encounter (HOSPITAL_COMMUNITY): Payer: Self-pay

## 2019-07-18 ENCOUNTER — Emergency Department (HOSPITAL_COMMUNITY): Payer: Medicaid Other

## 2019-07-18 DIAGNOSIS — R0789 Other chest pain: Secondary | ICD-10-CM | POA: Diagnosis present

## 2019-07-18 DIAGNOSIS — Z9884 Bariatric surgery status: Secondary | ICD-10-CM | POA: Diagnosis not present

## 2019-07-18 DIAGNOSIS — E162 Hypoglycemia, unspecified: Secondary | ICD-10-CM

## 2019-07-18 DIAGNOSIS — Z79899 Other long term (current) drug therapy: Secondary | ICD-10-CM | POA: Diagnosis not present

## 2019-07-18 DIAGNOSIS — Z7902 Long term (current) use of antithrombotics/antiplatelets: Secondary | ICD-10-CM | POA: Insufficient documentation

## 2019-07-18 DIAGNOSIS — Z7984 Long term (current) use of oral hypoglycemic drugs: Secondary | ICD-10-CM | POA: Diagnosis not present

## 2019-07-18 DIAGNOSIS — E11649 Type 2 diabetes mellitus with hypoglycemia without coma: Secondary | ICD-10-CM | POA: Diagnosis not present

## 2019-07-18 DIAGNOSIS — R079 Chest pain, unspecified: Secondary | ICD-10-CM

## 2019-07-18 LAB — BASIC METABOLIC PANEL
Anion gap: 8 (ref 5–15)
BUN: 6 mg/dL (ref 6–20)
CO2: 24 mmol/L (ref 22–32)
Calcium: 9.3 mg/dL (ref 8.9–10.3)
Chloride: 107 mmol/L (ref 98–111)
Creatinine, Ser: 0.73 mg/dL (ref 0.44–1.00)
GFR calc Af Amer: >60 mL/min (ref >60)
GFR calc non Af Amer: >60 mL/min (ref >60)
Glucose, Bld: 78 mg/dL (ref 70–99)
Potassium: 3.8 mmol/L (ref 3.5–5.1)
Sodium: 139 mmol/L (ref 135–145)

## 2019-07-18 LAB — CBG MONITORING, ED
Glucose-Capillary: 62 mg/dL — ABNORMAL LOW (ref 70–99)
Glucose-Capillary: 72 mg/dL (ref 70–99)
Glucose-Capillary: 50 mg/dL — ABNORMAL LOW (ref 70–99)

## 2019-07-18 LAB — CBC
HCT: 39.7 % (ref 36.0–46.0)
Hemoglobin: 13 g/dL (ref 12.0–15.0)
MCH: 30.1 pg (ref 26.0–34.0)
MCHC: 32.7 g/dL (ref 30.0–36.0)
MCV: 91.9 fL (ref 80.0–100.0)
Platelets: 268 10*3/uL (ref 150–400)
RBC: 4.32 MIL/uL (ref 3.87–5.11)
RDW: 14 % (ref 11.5–15.5)
WBC: 5.6 10*3/uL (ref 4.0–10.5)
nRBC: 0 % (ref 0.0–0.2)

## 2019-07-18 LAB — TROPONIN I (HIGH SENSITIVITY): Troponin I (High Sensitivity): <2 ng/L (ref <18)

## 2019-07-18 LAB — I-STAT BETA HCG BLOOD, ED (MC, WL, AP ONLY): I-stat hCG, quantitative: <5 m[IU]/mL (ref <5)

## 2019-07-18 MED ORDER — SODIUM CHLORIDE 0.9% FLUSH
3.0000 mL | Freq: Once | INTRAVENOUS | Status: AC
  Administered 2019-07-18: 3 mL via INTRAVENOUS

## 2019-07-18 MED ORDER — DEXTROSE 50 % IV SOLN
1.0000 | Freq: Once | INTRAVENOUS | Status: AC
  Administered 2019-07-18: 50 mL via INTRAVENOUS
  Filled 2019-07-18: qty 50

## 2019-07-18 NOTE — ED Triage Notes (Signed)
Pt arrives to ED w/ c/o chest pain and hypoglycemia. Pt reports 6/10 centrally located, non-radiating chest pain that started around dinner time. Pt states she has a bacterial overgrowth and has been having trouble w/ her sugars recently. Pt sugar was 37 at home, 62 in triage.

## 2019-07-18 NOTE — ED Provider Notes (Signed)
[facility] EMERGENCY DEPARTMENT Provider Note   CSN: [id number] Arrival date & time: 07/18/19  2121     History Chief Complaint  Patient presents with  . Hypoglycemia  . Chest Pain    Linda Shelton is a 34 y.o. female.  The history is provided by the patient and medical records.  Hypoglycemia Chest Pain  34 year old female with history of anemia, diabetes (no longer on medication), history of gastric bypass, rheumatoid arthritis, presenting to the ED with hypoglycemia and chest pain.  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  States today she took a shower before bed and started feeling lightheaded.  States her husband sat her down and checked her sugar and it was 37.  States usually her glucose will be anywhere from 80-1 20.  He did give her some orange juice with improvement.  CBG was 62 on arrival here.  States she started feeling a little bit of chest pain, attributed that to acid reflux which has improved with her home omeprazole.  She denies any vomiting.  Feels like she has been eating and drinking well recently.  She is also on prednisone for her RA, so quite unusual for her sugars to be low like this.  Denies any known cardiac history.  She has not had any fever or other infectious symptoms.  She is currently following with general surgery as well as GI.  Past Medical History:  Diagnosis Date  . Anemia   . Diabetes mellitus without complication ([place])   . Hypoglycemia   . Pericarditis   . Pleural effusion associated with pulmonary infection   . RA (rheumatoid arthritis) [facility])     Patient Active Problem List   Diagnosis Date Noted  . Acute appendicitis 03/03/2019    Past Surgical History:  Procedure Laterality Date  . GASTRIC BYPASS    . LAPAROSCOPIC APPENDECTOMY N/A 03/03/2019   Procedure: APPENDECTOMY LAPAROSCOPIC;  Surgeon: Stark Klein, MD;   Location: [facility];  Service: General;  Laterality: N/A;  . TUBAL LIGATION       OB History   No obstetric history on file.     No family history on file.  Social History   Tobacco Use  . Smoking status: Never Smoker  . Smokeless tobacco: Never Used  Vaping Use  . Vaping Use: Never used  Substance Use Topics  . Alcohol use: No  . Drug use: No    Home Medications Prior to Admission medications   Medication Sig Start Date End Date Taking? Authorizing Provider  acetaminophen (TYLENOL) 325 MG tablet Take 2-3 tablets (650-975 mg total) by mouth every 6 (six) hours as needed for mild pain, moderate pain, fever or headache (or Fever >/= 101). 03/04/19  Yes Norm Parcel, PA-C  albuterol (VENTOLIN HFA) 108 (90 Base) MCG/ACT inhaler Inhale 1-2 puffs into the lungs every 6 (six) hours as needed for wheezing or shortness of breath. 01/19/19  Yes Fondaw, Wylder S, PA  ascorbic acid (VITAMIN C) 1000 MG tablet Take 1,000 mg by mouth daily.    Yes [provider]  cetirizine (ZYRTEC) 10 MG tablet Take 10 mg by mouth daily.   Yes [provider]  hydroxychloroquine (PLAQUENIL) 200 MG tablet Take 300 mg by mouth daily.  10/08/18  Yes [provider]  KRILL OIL PO Take 1 capsule by mouth daily.   Yes [provider]  omeprazole (PRILOSEC) 20 MG capsule  Take 20 mg by mouth daily as needed (for reflux).  08/15/18  Yes [provider]  predniSONE (DELTASONE) 5 MG tablet Take 5 mg by mouth See admin instructions. Take 5 mg by mouth as needed for R.A.- every other day for 7 days at a time 10/08/18  Yes [provider]  Prenatal Vit-Fe Fumarate-FA (PRENATAL MULTIVITAMIN) TABS tablet Take 1 tablet by mouth daily at 12 noon.   Yes [provider]  Vitamin D, Ergocalciferol, (DRISDOL) 1.25 MG (50000 UNIT) CAPS capsule Take 50,000 Units by mouth every Monday.  09/10/18  Yes [provider]  norelgestromin-ethinyl estradiol (ORTHO EVRA) 150-35  MCG/24HR transdermal patch Place 1 patch onto the skin once a week. Patient not taking: Reported on 07/18/2019 01/31/19   [provider]  oxyCODONE (OXY IR/ROXICODONE) 5 MG immediate release tablet Take 1 tablet (5 mg total) by mouth every 6 (six) hours as needed for moderate pain. Patient not taking: Reported on 07/18/2019 03/04/19   Juliet Rude, PA-C    Allergies    Patient has no known allergies.  Review of Systems   Review of Systems  Cardiovascular: Positive for chest pain.  All other systems reviewed and are negative.   Physical Exam Updated Vital Signs BP 138/90 (BP Location: Right Arm)   Pulse 88   Temp 98.1 F (36.7 C) (Oral)   Resp 16   SpO2 99%   Physical Exam Vitals and nursing note reviewed.  Constitutional:      Appearance: She is well-developed.     Comments: Well appearing, NAD  HENT:     Head: Normocephalic and atraumatic.  Eyes:     Conjunctiva/sclera: Conjunctivae normal.     Pupils: Pupils are equal, round, and reactive to light.  Cardiovascular:     Rate and Rhythm: Normal rate and regular rhythm.     Heart sounds: Normal heart sounds.  Pulmonary:     Effort: Pulmonary effort is normal.     Breath sounds: Normal breath sounds. No wheezing or rhonchi.  Abdominal:     General: Bowel sounds are normal.     Palpations: Abdomen is soft.     Tenderness: There is no abdominal tenderness. There is no guarding or rebound.  Musculoskeletal:        General: Normal range of motion.     Cervical back: Normal range of motion.  Skin:    General: Skin is warm and dry.  Neurological:     Mental Status: She is alert and oriented to person, place, and time.     ED Results / Procedures / Treatments   Labs (all labs ordered are listed, but only abnormal results are displayed) Labs Reviewed  CBG MONITORING, ED - Abnormal; Notable for the following components:      Result Value   Glucose-Capillary 62 (*)    All other components within normal limits    CBG MONITORING, ED - Abnormal; Notable for the following components:   Glucose-Capillary 50 (*)    All other components within normal limits  BASIC METABOLIC PANEL  CBC  I-STAT BETA HCG BLOOD, ED ([facility], [facility], [facility] ONLY)  CBG MONITORING, ED  CBG MONITORING, ED  CBG MONITORING, ED  CBG MONITORING, ED  TROPONIN I (HIGH SENSITIVITY)    EKG EKG Interpretation  Date/Time:  Thursday July 18 2019 21:31:11 EDT Ventricular Rate:  81 PR Interval:  138 QRS Duration: 96 QT Interval:  358 QTC Calculation: 415 R Axis:   79 Text Interpretation: Normal sinus  rhythm Normal ECG Confirmed by Lorre Nick ([id number]) on 07/18/2019 10:39:25 PM   Radiology DG Chest 2 View  Result Date: 07/18/2019 CLINICAL DATA:  Chest pain and shortness of breath. EXAM: CHEST - 2 VIEW COMPARISON:  January 19, 2019 FINDINGS: There is no evidence of acute infiltrate, pleural effusion or pneumothorax. Round, subcentimeter calcified nodular opacities are seen overlying the lateral aspects of the upper lung fields, bilaterally. The heart size and mediastinal contours are within normal limits. The visualized skeletal structures are unremarkable. Radiopaque surgical clips are seen within the upper abdomen, along the midline. IMPRESSION: No active cardiopulmonary disease. Electronically Signed   By: Aram Candela M.D.   On: 07/18/2019 21:54    Procedures Procedures (including critical care time)  Medications Ordered in ED Medications  sodium chloride flush (NS) 0.9 % injection 3 mL (3 mLs Intravenous Given 07/18/19 2200)    ED Course  I have reviewed the triage vital signs and the nursing notes.  Pertinent labs & imaging results that were available during my care of the patient were reviewed by me and considered in my medical decision making (see chart for details).    MDM Rules/Calculators/A&P    34 year old female presenting to the ED with chest pain and hypoglycemia.  Had episode of chest pain tonight, states it  feels like acid reflux and improved with her home omeprazole.  EKG here without acute ischemic changes.  Labs are reassuring, troponin negative.  Chest x-ray is clear.  Suspect GERD related.  No cardiac risk factors, feel ACS, PE, dissection less likely.  Patient also with hypoglycemia.  States she has been having issues with this over the past year.  She is status post gastric bypass.  Prior history of diabetes but no longer on medications for this.  States she was told this may be related to bacterial overgrowth of her colon, she is currently following with general surgery and GI.  She is also currently on prednisone for rheumatoid arthritis.  CBG at home was 37, 62 on arrival to ED.  Patient was given orange juice and snacks, improved to 72.  Will monitor.  10:59 PM CBG re-checked within 1 hour, back down to 50.  Will give amp D50 and continue pushing oral intake.  Will continue to monitor.  3:58 AM Patient has had 3 subsequent blood sugars 75+ following amp of D50.  At this point, feel she is stable for discharge.  She will need to follow-up with her primary care doctor regarding her blood sugars.  She will need to monitor sugars closely at home, make sure to eat regular meals and snacks, continue all home medications.  She may return here for any new/acute changes.  Final Clinical Impression(s) / ED Diagnoses Final diagnoses:  Hypoglycemia  Chest pain in adult    Rx / DC Orders ED Discharge Orders    None       Garlon Hatchet, PA-C 07/19/19 0441    Lorre Nick, MD 07/23/19 1052

## 2019-07-18 NOTE — ED Notes (Signed)
Pt provided with meal and orange juice

## 2019-07-19 LAB — CBG MONITORING, ED
Glucose-Capillary: 75 mg/dL (ref 70–99)
Glucose-Capillary: 75 mg/dL (ref 70–99)
Glucose-Capillary: 80 mg/dL (ref 70–99)

## 2019-07-19 NOTE — Discharge Instructions (Signed)
Cardiac work-up today was normal.  May be due to acid reflux. Please follow-up closely with your primary care doctor about your blood sugar. Keep a close check of glucose at home-- make sure to eat regular meals and snacks when needed. Return here for new concerns.

## 2019-07-19 NOTE — ED Notes (Signed)
Discharge instructions discussed with pt. Pt verbalized understanding. Pt stable and ambulatory. No signature pad available. 

## 2019-09-08 ENCOUNTER — Encounter (HOSPITAL_COMMUNITY): Payer: Self-pay | Admitting: Emergency Medicine

## 2019-09-08 ENCOUNTER — Other Ambulatory Visit: Payer: Self-pay

## 2019-09-08 ENCOUNTER — Ambulatory Visit (HOSPITAL_COMMUNITY)
Admission: EM | Admit: 2019-09-08 | Discharge: 2019-09-08 | Disposition: A | Discharge status: Home / Self Care | Payer: Medicaid Other | Attending: Urgent Care | Admitting: Urgent Care

## 2019-09-08 DIAGNOSIS — R300 Dysuria: Secondary | ICD-10-CM | POA: Diagnosis present

## 2019-09-08 DIAGNOSIS — M545 Low back pain, unspecified: Secondary | ICD-10-CM

## 2019-09-08 DIAGNOSIS — R350 Frequency of micturition: Secondary | ICD-10-CM | POA: Insufficient documentation

## 2019-09-08 DIAGNOSIS — R1030 Lower abdominal pain, unspecified: Secondary | ICD-10-CM | POA: Diagnosis present

## 2019-09-08 DIAGNOSIS — Z3202 Encounter for pregnancy test, result negative: Secondary | ICD-10-CM | POA: Diagnosis not present

## 2019-09-08 DIAGNOSIS — N3001 Acute cystitis with hematuria: Secondary | ICD-10-CM | POA: Diagnosis present

## 2019-09-08 DIAGNOSIS — N390 Urinary tract infection, site not specified: Secondary | ICD-10-CM | POA: Diagnosis not present

## 2019-09-08 LAB — POCT URINALYSIS DIPSTICK, ED / UC
Bilirubin Urine: NEGATIVE
Glucose, UA: NEGATIVE mg/dL
Ketones, ur: NEGATIVE mg/dL
Nitrite: POSITIVE — AB
Protein, ur: NEGATIVE mg/dL
Specific Gravity, Urine: 1.015 (ref 1.005–1.030)
Urobilinogen, UA: 0.2 mg/dL (ref 0.0–1.0)
pH: 6.5 (ref 5.0–8.0)

## 2019-09-08 LAB — POC URINE PREG, ED: Preg Test, Ur: NEGATIVE

## 2019-09-08 MED ORDER — SULFAMETHOXAZOLE-TRIMETHOPRIM 800-160 MG PO TABS
1.0000 | ORAL_TABLET | Freq: Two times a day (BID) | ORAL | 0 refills | Status: DC
Start: 2019-09-08 — End: 2020-12-12

## 2019-09-08 NOTE — ED Provider Notes (Signed)
[facility]   MRN: [id number] DOB: [birth year]  Subjective:   Linda Shelton is a 34 y.o. female presenting for 3-day history of acute onset dysuria, urinary frequency, malodorous urine, cloudy urine.  Has started to have lower belly pain, low back pain.  Admits that these are more chronic in nature but not really changed.  Denies fever, nausea, vomiting.  No current facility-administered medications for this encounter.  Current Outpatient Medications:  .  ascorbic acid (VITAMIN C) 1000 MG tablet, Take 1,000 mg by mouth daily. , Disp: , Rfl:  .  cetirizine (ZYRTEC) 10 MG tablet, Take 10 mg by mouth daily., Disp: , Rfl:  .  hydroxychloroquine (PLAQUENIL) 200 MG tablet, Take 300 mg by mouth daily. , Disp: , Rfl:  .  NON FORMULARY, xifaxin  Finished this morning, Disp: , Rfl:  .  Prenatal Vit-Fe Fumarate-FA (PRENATAL MULTIVITAMIN) TABS tablet, Take 1 tablet by mouth daily at 12 noon., Disp: , Rfl:  .  Vitamin D, Ergocalciferol, (DRISDOL) 1.25 MG (50000 UNIT) CAPS capsule, Take 50,000 Units by mouth every Monday. , Disp: , Rfl:  .  acetaminophen (TYLENOL) 325 MG tablet, Take 2-3 tablets (650-975 mg total) by mouth every 6 (six) hours as needed for mild pain, moderate pain, fever or headache (or Fever >/= 101)., Disp:  , Rfl:  .  albuterol (VENTOLIN HFA) 108 (90 Base) MCG/ACT inhaler, Inhale 1-2 puffs into the lungs every 6 (six) hours as needed for wheezing or shortness of breath., Disp: 6.7 g, Rfl: 0 .  KRILL OIL PO, Take 1 capsule by mouth daily., Disp: , Rfl:  .  norelgestromin-ethinyl estradiol (ORTHO EVRA) 150-35 MCG/24HR transdermal patch, Place 1 patch onto the skin once a week. (Patient not taking: Reported on 07/18/2019), Disp: , Rfl:  .  omeprazole (PRILOSEC) 20 MG capsule, Take 20 mg by mouth daily as needed (for reflux). , Disp: , Rfl:  .  oxyCODONE (OXY IR/ROXICODONE) 5 MG immediate release tablet, Take 1 tablet (5 mg total) by mouth every 6 (six) hours as needed for moderate  pain., Disp: 15 tablet, Rfl: 0 .  predniSONE (DELTASONE) 5 MG tablet, Take 5 mg by mouth See admin instructions. Take 5 mg by mouth as needed for R.A.- every other day for 7 days at a time, Disp: , Rfl:    Allergies  Allergen Reactions  . Amoxicillin-Pot Clavulanate Other (See Comments)    Stomach upset/pain    Past Medical History:  Diagnosis Date  . Anemia   . Diabetes mellitus without complication ([facility])   . Hypoglycemia   . Pericarditis   . Pleural effusion associated with pulmonary infection   . RA (rheumatoid arthritis) ([facility])      Past Surgical History:  Procedure Laterality Date  . GASTRIC BYPASS    . LAPAROSCOPIC APPENDECTOMY N/A 03/03/2019   Procedure: APPENDECTOMY LAPAROSCOPIC;  Surgeon: Almond Lint, MD;  Location: [facility];  Service: General;  Laterality: N/A;  . TUBAL LIGATION      No family history on file.  Social History   Tobacco Use  . Smoking status: Never Smoker  . Smokeless tobacco: Never Used  Vaping Use  . Vaping Use: Never used  Substance Use Topics  . Alcohol use: No  . Drug use: No    ROS   Objective:   Vitals: BP (!) 93/55 (BP Location: Right Arm) Comment (BP Location): large cuff  Pulse 82   Temp 98.2 F (36.8 C) (Oral)   Resp 18   LMP 09/05/2019  SpO2 100%   Wt Readings from Last 3 Encounters:  01/19/19 179 lb (81.2 kg)  04/11/17 166 lb (75.3 kg)   Temp Readings from Last 3 Encounters:  09/08/19 98.2 F (36.8 C) (Oral)  07/19/19 98.1 F (36.7 C) (Oral)  03/04/19 98.4 F (36.9 C)   BP Readings from Last 3 Encounters:  09/08/19 (!) 93/55  07/19/19 98/66  03/04/19 117/79   Pulse Readings from Last 3 Encounters:  09/08/19 82  07/19/19 65  03/04/19 61   Physical Exam Constitutional:      General: She is not in acute distress.    Appearance: Normal appearance. She is well-developed. She is not ill-appearing, toxic-appearing or diaphoretic.  HENT:     Head: Normocephalic and atraumatic.     Nose: Nose normal.      Mouth/Throat:     Mouth: Mucous membranes are moist.     Pharynx: Oropharynx is clear.  Eyes:     General: No scleral icterus.       Right eye: No discharge.        Left eye: No discharge.     Extraocular Movements: Extraocular movements intact.     Conjunctiva/sclera: Conjunctivae normal.     Pupils: Pupils are equal, round, and reactive to light.  Cardiovascular:     Rate and Rhythm: Normal rate.  Pulmonary:     Effort: Pulmonary effort is normal.  Abdominal:     General: Bowel sounds are normal. There is no distension.     Palpations: Abdomen is soft. There is no mass.     Tenderness: There is abdominal tenderness (lower abdomen, generalized). There is no right CVA tenderness, left CVA tenderness, guarding or rebound.  Skin:    General: Skin is warm and dry.  Neurological:     General: No focal deficit present.     Mental Status: She is alert and oriented to person, place, and time.  Psychiatric:        Mood and Affect: Mood normal.        Behavior: Behavior normal.        Thought Content: Thought content normal.        Judgment: Judgment normal.     Results for orders placed or performed during the hospital encounter of 09/08/19 (from the past 24 hour(s))  POC Urinalysis dipstick     Status: Abnormal   Collection Time: 09/08/19  5:14 PM  Result Value Ref Range   Glucose, UA NEGATIVE NEGATIVE mg/dL   Bilirubin Urine NEGATIVE NEGATIVE   Ketones, ur NEGATIVE NEGATIVE mg/dL   Specific Gravity, Urine 1.015 1.005 - 1.030   Hgb urine dipstick MODERATE (A) NEGATIVE   pH 6.5 5.0 - 8.0   Protein, ur NEGATIVE NEGATIVE mg/dL   Urobilinogen, UA 0.2 0.0 - 1.0 mg/dL   Nitrite POSITIVE (A) NEGATIVE   Leukocytes,Ua LARGE (A) NEGATIVE  POC urine pregnancy     Status: None   Collection Time: 09/08/19  5:19 PM  Result Value Ref Range   Preg Test, Ur NEGATIVE NEGATIVE    Assessment and Plan :   PDMP not reviewed this encounter.  1. Acute cystitis with hematuria   2. Dysuria     3. Urinary frequency   4. Lower abdominal pain   5. Acute low back pain without sciatica, unspecified back pain laterality     Start Bactrim to cover for hemorrhagic cystitis.  Urine culture pending.  Recommended aggressive hydration.  No signs of pyelonephritis.  Counseled patient on potential  for adverse effects with medications prescribed/recommended today, ER and return-to-clinic precautions discussed, patient verbalized understanding.    Wallis Bamberg, [place] 09/08/19 1738

## 2019-09-08 NOTE — ED Triage Notes (Addendum)
Symptoms started 3 days ago.  Patient has pain with urination, cloudy urine, odor to urine per patient.  Lower abdominal pain and lower back pain

## 2019-09-08 NOTE — Discharge Instructions (Addendum)
Make sure you hydrate very well with plain water and a quantity of 64 ounces of water a day.  Please limit drinks that are considered urinary irritants such as soda, sweet tea, coffee, energy drinks, alcohol.  These can worsen your UTI symptoms and also be the source of them.  I will let you know about your urine culture results through MyChart to see if we need to change your antibiotics based off of those results. ° °

## 2019-09-10 LAB — URINE CULTURE: Culture: >=100000 — AB

## 2019-09-25 ENCOUNTER — Ambulatory Visit
Admission: EM | Admit: 2019-09-25 | Discharge: 2019-09-25 | Disposition: A | Discharge status: Home / Self Care | Payer: Medicaid Other | Attending: Emergency Medicine | Admitting: Emergency Medicine

## 2019-09-25 DIAGNOSIS — Z1152 Encounter for screening for COVID-19: Secondary | ICD-10-CM

## 2019-09-25 NOTE — ED Triage Notes (Signed)
Pt needs covid testing due to recent travel. 

## 2019-09-25 NOTE — Discharge Instructions (Signed)

## 2019-09-27 LAB — NOVEL CORONAVIRUS, NAA: SARS-CoV-2, NAA: NOT DETECTED

## 2020-01-30 ENCOUNTER — Other Ambulatory Visit: Payer: Medicaid Other

## 2020-01-30 DIAGNOSIS — Z20822 Contact with and (suspected) exposure to covid-19: Secondary | ICD-10-CM

## 2020-02-03 LAB — NOVEL CORONAVIRUS, NAA: SARS-CoV-2, NAA: DETECTED — AB

## 2020-02-04 ENCOUNTER — Telehealth: Payer: Self-pay | Admitting: *Deleted

## 2020-02-05 NOTE — Telephone Encounter (Signed)
No answer. Left VM to return call. 

## 2020-12-11 ENCOUNTER — Encounter (HOSPITAL_BASED_OUTPATIENT_CLINIC_OR_DEPARTMENT_OTHER): Payer: Self-pay | Admitting: *Deleted

## 2020-12-11 ENCOUNTER — Other Ambulatory Visit: Payer: Self-pay

## 2020-12-11 DIAGNOSIS — Z79899 Other long term (current) drug therapy: Secondary | ICD-10-CM | POA: Insufficient documentation

## 2020-12-11 DIAGNOSIS — K561 Intussusception: Secondary | ICD-10-CM | POA: Diagnosis not present

## 2020-12-11 DIAGNOSIS — Z20822 Contact with and (suspected) exposure to covid-19: Secondary | ICD-10-CM | POA: Diagnosis not present

## 2020-12-11 DIAGNOSIS — Z9889 Other specified postprocedural states: Secondary | ICD-10-CM | POA: Insufficient documentation

## 2020-12-11 DIAGNOSIS — E119 Type 2 diabetes mellitus without complications: Secondary | ICD-10-CM | POA: Insufficient documentation

## 2020-12-11 NOTE — ED Triage Notes (Signed)
RLQ pain x 2 days.  Denies N/V/D.

## 2020-12-12 ENCOUNTER — Observation Stay (HOSPITAL_BASED_OUTPATIENT_CLINIC_OR_DEPARTMENT_OTHER)
Admission: EM | Admit: 2020-12-12 | Discharge: 2020-12-15 | Disposition: A | Discharge status: Home / Self Care | Payer: Medicaid Other | Attending: General Surgery | Admitting: General Surgery

## 2020-12-12 ENCOUNTER — Emergency Department (HOSPITAL_BASED_OUTPATIENT_CLINIC_OR_DEPARTMENT_OTHER): Payer: Medicaid Other

## 2020-12-12 ENCOUNTER — Encounter (HOSPITAL_BASED_OUTPATIENT_CLINIC_OR_DEPARTMENT_OTHER): Payer: Self-pay

## 2020-12-12 ENCOUNTER — Emergency Department (HOSPITAL_COMMUNITY): Payer: Medicaid Other | Admitting: Registered Nurse

## 2020-12-12 ENCOUNTER — Encounter (HOSPITAL_COMMUNITY)
Admission: EM | Disposition: A | Discharge status: Home / Self Care | Payer: Self-pay | Source: Home / Self Care | Attending: Emergency Medicine

## 2020-12-12 DIAGNOSIS — E119 Type 2 diabetes mellitus without complications: Secondary | ICD-10-CM | POA: Diagnosis not present

## 2020-12-12 DIAGNOSIS — Z79899 Other long term (current) drug therapy: Secondary | ICD-10-CM | POA: Diagnosis not present

## 2020-12-12 DIAGNOSIS — Z20822 Contact with and (suspected) exposure to covid-19: Secondary | ICD-10-CM | POA: Diagnosis not present

## 2020-12-12 DIAGNOSIS — K561 Intussusception: Secondary | ICD-10-CM | POA: Diagnosis present

## 2020-12-12 DIAGNOSIS — Z9889 Other specified postprocedural states: Secondary | ICD-10-CM | POA: Diagnosis not present

## 2020-12-12 HISTORY — PX: LAPAROTOMY: SHX154

## 2020-12-12 LAB — GLUCOSE, CAPILLARY: Glucose-Capillary: 95 mg/dL (ref 70–99)

## 2020-12-12 LAB — LIPASE, BLOOD: Lipase: 31 U/L (ref 11–51)

## 2020-12-12 LAB — COMPREHENSIVE METABOLIC PANEL
ALT: 15 U/L (ref 0–44)
AST: 23 U/L (ref 15–41)
Albumin: 4.5 g/dL (ref 3.5–5.0)
Alkaline Phosphatase: 91 U/L (ref 38–126)
Anion gap: 10 (ref 5–15)
BUN: 14 mg/dL (ref 6–20)
CO2: 23 mmol/L (ref 22–32)
Calcium: 9.1 mg/dL (ref 8.9–10.3)
Chloride: 103 mmol/L (ref 98–111)
Creatinine, Ser: 0.64 mg/dL (ref 0.44–1.00)
GFR, Estimated: >60 mL/min (ref >60)
Glucose, Bld: 78 mg/dL (ref 70–99)
Potassium: 4.1 mmol/L (ref 3.5–5.1)
Sodium: 136 mmol/L (ref 135–145)
Total Bilirubin: 0.5 mg/dL (ref 0.3–1.2)
Total Protein: 7.6 g/dL (ref 6.5–8.1)

## 2020-12-12 LAB — URINALYSIS, ROUTINE W REFLEX MICROSCOPIC
Bilirubin Urine: NEGATIVE
Glucose, UA: NEGATIVE mg/dL
Hgb urine dipstick: NEGATIVE
Ketones, ur: NEGATIVE mg/dL
Leukocytes,Ua: NEGATIVE
Nitrite: NEGATIVE
Protein, ur: NEGATIVE mg/dL
Specific Gravity, Urine: 1.02 (ref 1.005–1.030)
pH: 5.5 (ref 5.0–8.0)

## 2020-12-12 LAB — CBC
HCT: 36.7 % (ref 36.0–46.0)
Hemoglobin: 12.2 g/dL (ref 12.0–15.0)
MCH: 29.2 pg (ref 26.0–34.0)
MCHC: 33.2 g/dL (ref 30.0–36.0)
MCV: 87.8 fL (ref 80.0–100.0)
Platelets: 222 10*3/uL (ref 150–400)
RBC: 4.18 MIL/uL (ref 3.87–5.11)
RDW: 12.9 % (ref 11.5–15.5)
WBC: 7.6 10*3/uL (ref 4.0–10.5)
nRBC: 0 % (ref 0.0–0.2)

## 2020-12-12 LAB — RESP PANEL BY RT-PCR (FLU A&B, COVID) ARPGX2
Influenza A by PCR: NEGATIVE
Influenza B by PCR: NEGATIVE
SARS Coronavirus 2 by RT PCR: NEGATIVE

## 2020-12-12 LAB — PREGNANCY, URINE: Preg Test, Ur: NEGATIVE

## 2020-12-12 SURGERY — LAPAROTOMY, EXPLORATORY
Anesthesia: General

## 2020-12-12 MED ORDER — ONDANSETRON HCL 4 MG/2ML IJ SOLN: 4.0000 mg | Freq: Four times a day (QID) | INTRAMUSCULAR | Status: DC | PRN

## 2020-12-12 MED ORDER — CIPROFLOXACIN IN D5W 400 MG/200ML IV SOLN
400.0000 mg | Freq: Once | INTRAVENOUS | Status: AC
  Administered 2020-12-12: 400 mg via INTRAVENOUS

## 2020-12-12 MED ORDER — MEPERIDINE HCL 50 MG/ML IJ SOLN: 6.2500 mg | INTRAMUSCULAR | Status: DC | PRN

## 2020-12-12 MED ORDER — FENTANYL CITRATE (PF) 100 MCG/2ML IJ SOLN
INTRAMUSCULAR | Status: DC | PRN
  Administered 2020-12-12: 25 ug via INTRAVENOUS
  Administered 2020-12-12: 50 ug via INTRAVENOUS
  Administered 2020-12-12: 25 ug via INTRAVENOUS
  Administered 2020-12-12: 100 ug via INTRAVENOUS

## 2020-12-12 MED ORDER — 0.9 % SODIUM CHLORIDE (POUR BTL) OPTIME
TOPICAL | Status: DC | PRN
  Administered 2020-12-12: 2000 mL

## 2020-12-12 MED ORDER — ACETAMINOPHEN 10 MG/ML IV SOLN
1000.0000 mg | Freq: Once | INTRAVENOUS | Status: AC
  Administered 2020-12-12: 1000 mg via INTRAVENOUS

## 2020-12-12 MED ORDER — OXYCODONE HCL 5 MG PO TABS: 5.0000 mg | ORAL_TABLET | Freq: Once | ORAL | Status: DC | PRN

## 2020-12-12 MED ORDER — SODIUM CHLORIDE 0.9 % IV BOLUS
500.0000 mL | Freq: Once | INTRAVENOUS | Status: AC
  Administered 2020-12-12: 500 mL via INTRAVENOUS

## 2020-12-12 MED ORDER — NORELGESTROMIN-ETH ESTRADIOL 150-35 MCG/24HR TD PTWK: 1.0000 | MEDICATED_PATCH | TRANSDERMAL | Status: DC

## 2020-12-12 MED ORDER — LACTATED RINGERS IV SOLN: INTRAVENOUS | Status: AC

## 2020-12-12 MED ORDER — ACETAMINOPHEN 10 MG/ML IV SOLN
INTRAVENOUS | Status: AC
  Filled 2020-12-12: qty 100

## 2020-12-12 MED ORDER — LIDOCAINE 2% (20 MG/ML) 5 ML SYRINGE
INTRAMUSCULAR | Status: DC | PRN
  Administered 2020-12-12: 75 mg via INTRAVENOUS

## 2020-12-12 MED ORDER — HYDROMORPHONE HCL 1 MG/ML IJ SOLN
1.0000 mg | Freq: Once | INTRAMUSCULAR | Status: AC
  Administered 2020-12-12: 1 mg via INTRAVENOUS
  Filled 2020-12-12: qty 1

## 2020-12-12 MED ORDER — SUGAMMADEX SODIUM 500 MG/5ML IV SOLN
INTRAVENOUS | Status: DC | PRN
  Administered 2020-12-12: 300 mg via INTRAVENOUS

## 2020-12-12 MED ORDER — OXYCODONE HCL 5 MG/5ML PO SOLN: 5.0000 mg | Freq: Once | ORAL | Status: DC | PRN

## 2020-12-12 MED ORDER — PREDNISONE 5 MG PO TABS: 5.0000 mg | ORAL_TABLET | ORAL | Status: DC

## 2020-12-12 MED ORDER — VITAMIN D (ERGOCALCIFEROL) 1.25 MG (50000 UNIT) PO CAPS
50000.0000 [IU] | ORAL_CAPSULE | ORAL | Status: DC
  Administered 2020-12-14: 50000 [IU] via ORAL
  Filled 2020-12-12: qty 1

## 2020-12-12 MED ORDER — METRONIDAZOLE 500 MG/100ML IV SOLN
500.0000 mg | Freq: Once | INTRAVENOUS | Status: AC
  Administered 2020-12-12: 500 mg via INTRAVENOUS
  Filled 2020-12-12: qty 100

## 2020-12-12 MED ORDER — PANTOPRAZOLE SODIUM 40 MG IV SOLR
40.0000 mg | Freq: Every day | INTRAVENOUS | Status: DC
  Administered 2020-12-12 – 2020-12-14 (×3): 40 mg via INTRAVENOUS
  Filled 2020-12-12 (×3): qty 40

## 2020-12-12 MED ORDER — ONDANSETRON HCL 4 MG/2ML IJ SOLN
4.0000 mg | Freq: Once | INTRAMUSCULAR | Status: AC
  Administered 2020-12-12: 4 mg via INTRAVENOUS
  Filled 2020-12-12: qty 2

## 2020-12-12 MED ORDER — SULFAMETHOXAZOLE-TRIMETHOPRIM 800-160 MG PO TABS: 1.0000 | ORAL_TABLET | Freq: Two times a day (BID) | ORAL | Status: DC

## 2020-12-12 MED ORDER — AMISULPRIDE (ANTIEMETIC) 5 MG/2ML IV SOLN: 10.0000 mg | Freq: Once | INTRAVENOUS | Status: DC | PRN

## 2020-12-12 MED ORDER — FENTANYL CITRATE PF 50 MCG/ML IJ SOSY
50.0000 ug | PREFILLED_SYRINGE | Freq: Once | INTRAMUSCULAR | Status: AC
  Administered 2020-12-12: 50 ug via INTRAVENOUS
  Filled 2020-12-12: qty 1

## 2020-12-12 MED ORDER — HYDROMORPHONE HCL 1 MG/ML IJ SOLN
0.2500 mg | INTRAMUSCULAR | Status: DC | PRN
  Administered 2020-12-12 (×3): 0.5 mg via INTRAVENOUS

## 2020-12-12 MED ORDER — ONDANSETRON HCL 4 MG/2ML IJ SOLN
INTRAMUSCULAR | Status: DC | PRN
  Administered 2020-12-12: 4 mg via INTRAVENOUS

## 2020-12-12 MED ORDER — OXYCODONE HCL 5 MG PO TABS: 5.0000 mg | ORAL_TABLET | Freq: Four times a day (QID) | ORAL | Status: DC | PRN

## 2020-12-12 MED ORDER — ONDANSETRON HCL 4 MG/2ML IJ SOLN
4.0000 mg | Freq: Three times a day (TID) | INTRAMUSCULAR | Status: AC | PRN
  Administered 2020-12-12: 4 mg via INTRAVENOUS
  Filled 2020-12-12: qty 2

## 2020-12-12 MED ORDER — ROCURONIUM BROMIDE 10 MG/ML (PF) SYRINGE
PREFILLED_SYRINGE | INTRAVENOUS | Status: DC | PRN
  Administered 2020-12-12: 35 mg via INTRAVENOUS

## 2020-12-12 MED ORDER — ACETAMINOPHEN 325 MG PO TABS: 650.0000 mg | ORAL_TABLET | Freq: Four times a day (QID) | ORAL | Status: DC | PRN

## 2020-12-12 MED ORDER — HYDROMORPHONE HCL 1 MG/ML IJ SOLN
INTRAMUSCULAR | Status: AC
  Filled 2020-12-12: qty 1

## 2020-12-12 MED ORDER — DEXAMETHASONE SODIUM PHOSPHATE 10 MG/ML IJ SOLN
INTRAMUSCULAR | Status: DC | PRN
  Administered 2020-12-12: 10 mg via INTRAVENOUS

## 2020-12-12 MED ORDER — DEXTROSE-NACL 5-0.9 % IV SOLN: INTRAVENOUS | Status: DC

## 2020-12-12 MED ORDER — MIDAZOLAM HCL 5 MG/5ML IJ SOLN
INTRAMUSCULAR | Status: DC | PRN
  Administered 2020-12-12: 2 mg via INTRAVENOUS

## 2020-12-12 MED ORDER — ONDANSETRON 4 MG PO TBDP: 4.0000 mg | ORAL_TABLET | Freq: Four times a day (QID) | ORAL | Status: DC | PRN

## 2020-12-12 MED ORDER — IOHEXOL 300 MG/ML  SOLN
100.0000 mL | Freq: Once | INTRAMUSCULAR | Status: AC | PRN
  Administered 2020-12-12: 100 mL via INTRAVENOUS

## 2020-12-12 MED ORDER — KETOROLAC TROMETHAMINE 30 MG/ML IJ SOLN
INTRAMUSCULAR | Status: DC | PRN
  Administered 2020-12-12: 30 mg via INTRAVENOUS

## 2020-12-12 MED ORDER — HYDROXYCHLOROQUINE SULFATE 200 MG PO TABS: 300.0000 mg | ORAL_TABLET | Freq: Every day | ORAL | Status: DC

## 2020-12-12 MED ORDER — SUCCINYLCHOLINE CHLORIDE 200 MG/10ML IV SOSY
PREFILLED_SYRINGE | INTRAVENOUS | Status: DC | PRN
  Administered 2020-12-12: 120 mg via INTRAVENOUS

## 2020-12-12 MED ORDER — CIPROFLOXACIN IN D5W 400 MG/200ML IV SOLN
INTRAVENOUS | Status: AC
  Filled 2020-12-12: qty 200

## 2020-12-12 MED ORDER — ENOXAPARIN SODIUM 40 MG/0.4ML IJ SOSY
40.0000 mg | PREFILLED_SYRINGE | INTRAMUSCULAR | Status: DC
  Administered 2020-12-13 – 2020-12-15 (×3): 40 mg via SUBCUTANEOUS
  Filled 2020-12-12 (×2): qty 0.4

## 2020-12-12 MED ORDER — PROPOFOL 10 MG/ML IV BOLUS
INTRAVENOUS | Status: DC | PRN
  Administered 2020-12-12: 150 mg via INTRAVENOUS

## 2020-12-12 MED ORDER — ALBUTEROL SULFATE (2.5 MG/3ML) 0.083% IN NEBU: 3.0000 mL | INHALATION_SOLUTION | Freq: Four times a day (QID) | RESPIRATORY_TRACT | Status: DC | PRN

## 2020-12-12 MED ORDER — PRENATAL MULTIVITAMIN CH: 1.0000 | ORAL_TABLET | Freq: Every day | ORAL | Status: DC

## 2020-12-12 MED ORDER — METRONIDAZOLE 500 MG/100ML IV SOLN
INTRAVENOUS | Status: AC
  Filled 2020-12-12: qty 100

## 2020-12-12 MED ORDER — ASCORBIC ACID 500 MG PO TABS: 1000.0000 mg | ORAL_TABLET | Freq: Every day | ORAL | Status: DC

## 2020-12-12 MED ORDER — OXYCODONE HCL 5 MG PO TABS
5.0000 mg | ORAL_TABLET | ORAL | Status: DC | PRN
  Administered 2020-12-12 – 2020-12-14 (×7): 10 mg via ORAL
  Filled 2020-12-12 (×6): qty 2

## 2020-12-12 MED ORDER — PROMETHAZINE HCL 25 MG/ML IJ SOLN: 6.2500 mg | INTRAMUSCULAR | Status: DC | PRN

## 2020-12-12 MED ORDER — FENTANYL CITRATE PF 50 MCG/ML IJ SOSY
12.5000 ug | PREFILLED_SYRINGE | INTRAMUSCULAR | Status: DC | PRN
  Administered 2020-12-12: 12.5 ug via INTRAVENOUS
  Filled 2020-12-12: qty 1

## 2020-12-12 SURGICAL SUPPLY — 25 items
BAG COUNTER SPONGE SURGICOUNT (BAG) ×2 IMPLANT
BLADE EXTENDED COATED 6.5IN (ELECTRODE) IMPLANT
CHLORAPREP W/TINT 26 (MISCELLANEOUS) ×2 IMPLANT
DERMABOND ADVANCED (GAUZE/BANDAGES/DRESSINGS) ×1
DERMABOND ADVANCED .7 DNX12 (GAUZE/BANDAGES/DRESSINGS) ×1 IMPLANT
DRAPE LAPAROSCOPIC ABDOMINAL (DRAPES) ×2 IMPLANT
DRAPE WARM FLUID 44X44 (DRAPES) ×2 IMPLANT
DRSG OPSITE POSTOP 4X8 (GAUZE/BANDAGES/DRESSINGS) ×2 IMPLANT
ELECT REM PT RETURN 15FT ADLT (MISCELLANEOUS) ×2 IMPLANT
GAUZE SPONGE 4X4 12PLY STRL (GAUZE/BANDAGES/DRESSINGS) ×2 IMPLANT
GLOVE SURG ENC MOIS LTX SZ7.5 (GLOVE) ×2 IMPLANT
GOWN STRL REUS W/TWL LRG LVL3 (GOWN DISPOSABLE) ×6 IMPLANT
HANDLE SUCTION POOLE (INSTRUMENTS) ×1 IMPLANT
KIT BASIN OR (CUSTOM PROCEDURE TRAY) ×2 IMPLANT
KIT TURNOVER KIT A (KITS) ×2 IMPLANT
PACK GENERAL/GYN (CUSTOM PROCEDURE TRAY) ×2 IMPLANT
PROTECTOR NERVE ULNAR (MISCELLANEOUS) ×4 IMPLANT
SPONGE T-LAP 18X18 ~~LOC~~+RFID (SPONGE) ×4 IMPLANT
STAPLER VISISTAT 35W (STAPLE) ×2 IMPLANT
SUCTION POOLE HANDLE (INSTRUMENTS) ×2
SUT PDS AB 1 CTX 36 (SUTURE) ×4 IMPLANT
SUT SILK 2 0 SH CR/8 (SUTURE) ×2 IMPLANT
SUT SILK 3 0 SH CR/8 (SUTURE) ×2 IMPLANT
TOWEL OR 17X26 10 PK STRL BLUE (TOWEL DISPOSABLE) ×4 IMPLANT
YANKAUER SUCT BULB TIP NO VENT (SUCTIONS) ×2 IMPLANT

## 2020-12-12 NOTE — ED Notes (Signed)
Report called to Mia, RN in [facility] OR. All questions answered.

## 2020-12-12 NOTE — ED Notes (Signed)
Secure chat sent to MD regarding pt's pain. Awaiting response.

## 2020-12-12 NOTE — Anesthesia Postprocedure Evaluation (Signed)
Anesthesia Post Note  Patient: Linda Shelton  Procedure(s) Performed: EXPLORATORY LAPAROTOMY     Patient location during evaluation: PACU Anesthesia Type: General Level of consciousness: awake and alert Pain management: pain level controlled Vital Signs Assessment: post-procedure vital signs reviewed and stable Respiratory status: spontaneous breathing, nonlabored ventilation and respiratory function stable Cardiovascular status: blood pressure returned to baseline and stable Postop Assessment: no apparent nausea or vomiting Anesthetic complications: no   No notable events documented.  Last Vitals:  Vitals:   12/12/20 1253 12/12/20 1300  BP:  99/65  Pulse: 66 66  Resp: 14 11  Temp:    SpO2: 100% 100%    Last Pain:  Vitals:   12/12/20 1300  TempSrc:   PainSc: Asleep                 Lowella Curb

## 2020-12-12 NOTE — ED Notes (Signed)
Pt. Felt a hot flushed feeling immediately after receiving the dilaudid with elevation in BP 134/76 HR90 that subsided after 3 minutes.  Repeat BP 103/72  HR 73    O2 sat remains 100% on RA   VSS  Baseline  BP 90-100/60-77 HR 61-79

## 2020-12-12 NOTE — ED Provider Notes (Signed)
[facility] EMERGENCY DEPARTMENT Provider Note   CSN: [id number] Arrival date & time: 12/11/20  2346     History Chief Complaint  Patient presents with   Abdominal Pain    Linda Shelton is a 35 y.o. female.  The history is provided by the patient.  Abdominal Pain Linda Shelton is a 35 y.o. female who presents to the Emergency Department complaining of abdominal pain.  She presents to the ED for evaluation of RLQ abdominal pain that started Thursday morning.  Pain is described as constant and deep.  Pain is worse with movement.  Has poor appetite.  Took tylenol without significant improvement.  Pain is sharp in nature.  Had similar pain due to UTI but has no additional UTI sxs such as dysuria or foul smelling urine.    No nausea, vomiting, fever, vaginal discharge.   Has a hx/o appendectomy, gastric bypass, tubal ligation, RA on humira, SIBO.      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     History reviewed. No pertinent family history.  Social History   Tobacco Use   Smoking status: Never   Smokeless tobacco: Never  Vaping Use   Vaping Use: Never used  Substance Use Topics   Alcohol use: No   Drug use: No    Home Medications Prior to Admission medications   Medication Sig Start Date End Date Taking? Authorizing Provider  acetaminophen  (TYLENOL) 325 MG tablet Take 2-3 tablets (650-975 mg total) by mouth every 6 (six) hours as needed for mild pain, moderate pain, fever or headache (or Fever >/= 101). 03/04/19   Norm Parcel, PA-C  albuterol (VENTOLIN HFA) 108 (90 Base) MCG/ACT inhaler Inhale 1-2 puffs into the lungs every 6 (six) hours as needed for wheezing or shortness of breath. 01/19/19   Tedd Sias, PA  ascorbic acid (VITAMIN C) 1000 MG tablet Take 1,000 mg by mouth daily.     [provider]  cetirizine (ZYRTEC) 10 MG tablet Take 10 mg by mouth daily.    [provider]  hydroxychloroquine (PLAQUENIL) 200 MG tablet Take 300 mg by mouth daily.  10/08/18   [provider]  KRILL OIL PO Take 1 capsule by mouth daily.    [provider]  NON FORMULARY xifaxin  Finished this morning    [provider]  norelgestromin-ethinyl estradiol (ORTHO EVRA) 150-35 MCG/24HR transdermal patch Place 1 patch onto the skin once a week. Patient not taking: Reported on 07/18/2019 01/31/19   [provider]  omeprazole (PRILOSEC) 20 MG capsule Take 20 mg by mouth daily as needed (for reflux).  08/15/18   [provider]  oxyCODONE (OXY IR/ROXICODONE) 5 MG immediate release tablet Take 1 tablet (5 mg total) by mouth every 6 (six) hours as needed for  moderate pain. 03/04/19   Norm Parcel, PA-C  predniSONE (DELTASONE) 5 MG tablet Take 5 mg by mouth See admin instructions. Take 5 mg by mouth as needed for R.A.- every other day for 7 days at a time 10/08/18   [provider]  Prenatal Vit-Fe Fumarate-FA (PRENATAL MULTIVITAMIN) TABS tablet Take 1 tablet by mouth daily at 12 noon.    [provider]  sulfamethoxazole-trimethoprim (BACTRIM DS) 800-160 MG tablet Take 1 tablet by mouth 2 (two) times daily. 09/08/19   Jaynee Eagles, PA-C  Vitamin D, Ergocalciferol, (DRISDOL) 1.25 MG (50000 UNIT) CAPS capsule Take 50,000 Units by mouth every Monday.  09/10/18   [provider]    Allergies    Amoxicillin-pot clavulanate  Review of Systems   Review of Systems  Gastrointestinal:  Positive for abdominal pain.  All other systems reviewed and are negative.  Physical Exam Updated Vital Signs BP (!) 108/59   Pulse 87   Temp 97.7 F (36.5 C) (Oral)   Resp 18   Ht 5\' 3"  (1.6 m)   Wt 80.7 kg   SpO2 100%   BMI 31.53 kg/m   Physical Exam Vitals and nursing note reviewed.  Constitutional:      Appearance: She is well-developed.  HENT:     Head: Normocephalic and atraumatic.  Cardiovascular:     Rate and Rhythm: Normal rate and regular rhythm.     Heart sounds: No murmur heard. Pulmonary:     Effort: Pulmonary effort is normal. No respiratory distress.     Breath sounds: Normal breath sounds.  Abdominal:     Palpations: Abdomen is soft.     Tenderness: There is no guarding or rebound.     Comments: Moderate lower abdominal tenderness.  Mild RUQ tenderness  Musculoskeletal:        General: No tenderness.  Skin:    General: Skin is warm and dry.  Neurological:     Mental Status: She is alert and oriented to person, place, and time.  Psychiatric:        Behavior: Behavior normal.    ED Results / Procedures / Treatments   Labs (all labs ordered are listed, but only abnormal results are displayed) Labs Reviewed  RESP PANEL BY RT-PCR (FLU A&B, COVID) ARPGX2  LIPASE, BLOOD  COMPREHENSIVE METABOLIC PANEL  CBC  URINALYSIS, ROUTINE W REFLEX MICROSCOPIC  PREGNANCY, URINE    EKG None  Radiology CT Abdomen Pelvis W Contrast  Result Date: 12/12/2020 CLINICAL DATA:  Acute abdominal pain right side. History of appendectomy. History of gastric bypass. Right lower quadrant pain for 2 days. EXAM: CT ABDOMEN AND PELVIS WITH CONTRAST TECHNIQUE: Multidetector CT imaging of the abdomen and pelvis was performed using the standard protocol following bolus administration of intravenous contrast. CONTRAST:  178mL OMNIPAQUE IOHEXOL 300 MG/ML  SOLN COMPARISON:   The CT without contrast 06/13/2020 and the CT with contrast 03/10/2020 FINDINGS: Lower chest: No acute abnormality. Hepatobiliary: 7 mm hyperdensity in the posterior segment of the right lobe of liver on series 2 axial 26 is again noted and probably a flash filling hemangioma. A faint hypodensity measuring 2 cm in the inferior aspect of the right lobe of the liver is also again noted with prior MRI 07/29/2019 demonstrating findings most consistent with a hepatic adenoma. The liver is otherwise unremarkable. The gallbladder bile ducts unremarkable. Pancreas: Unremarkable. No pancreatic ductal dilatation or surrounding inflammatory changes. Spleen: Normal in size without focal abnormality. Adrenals/Urinary Tract: There is no renal  mass enhancement, calculus or hydronephrosis and no adrenal Mass. Stomach/Bowel: Old gastric bypass. The gastric wall unremarkable. There is dilatation of the small bowel anastomosis in the left mid abdomen due to a 5 cm telescoping of bowel through the surgical anastomosis with wall thickening in the intussusceptum. There is a mildly dilated short small-bowel segment just above this measuring 3.4 cm but the remainder of the small bowel is normal caliber. The appendix is absent. There is moderate stool retention. No evidence of colitis or diverticulitis. Vascular/Lymphatic: No significant vascular findings are present. No enlarged abdominal or pelvic lymph nodes. Reproductive: The uterus is intact. The adnexal areas are unremarkable. The ovaries are not enlarged. Other: There is no free air, hemorrhage or fluid Musculoskeletal: No acute or significant osseous findings. IMPRESSION: 1. 5 cm in length intussusception of the small bowel through an old small bowel surgical anastomosis in the left mid abdomen. There is a mildly dilated segment above the intussusception but the rest of the small bowel is normal caliber. There is wall thickening in the intussusceptum of unclear clinical  significance. This could be due to a primary mucosal disease process or edema such as due to early ischemia. There is no wall pneumatosis. Consultation with a surgeon is recommended. 2. Redemonstration of prior findings in the liver. 3. Constipation. Electronically Signed   By: Almira Bar M.D.   On: 12/12/2020 03:48    Procedures Procedures   Medications Ordered in ED Medications  lactated ringers infusion ( Intravenous New Bag/Given 12/12/20 0500)  ondansetron (ZOFRAN) injection 4 mg (4 mg Intravenous Given 12/12/20 0501)  fentaNYL (SUBLIMAZE) injection 50 mcg (50 mcg Intravenous Given 12/12/20 0259)  sodium chloride 0.9 % bolus 500 mL (0 mLs Intravenous Stopped 12/12/20 0355)  ondansetron (ZOFRAN) injection 4 mg (4 mg Intravenous Given 12/12/20 0259)  iohexol (OMNIPAQUE) 300 MG/ML solution 100 mL (100 mLs Intravenous Contrast Given 12/12/20 0324)  fentaNYL (SUBLIMAZE) injection 50 mcg (50 mcg Intravenous Given 12/12/20 0506)    ED Course  I have reviewed the triage vital signs and the nursing notes.  Pertinent labs & imaging results that were available during my care of the patient were reviewed by me and considered in my medical decision making (see chart for details).    MDM Rules/Calculators/A&P                          patient here for evaluation of right lower quadrant abdominal pain since Thursday. She does have significant lower abdominal tenderness in the left lower and right lower quadrant. Labs are unremarkable. The CT scan was obtained, which demonstrates intussusception. On repeat assessment after pain meds that she is feeling partially improved but does have ongoing tenderness on examination. Discussed with Dr. Dwain Sarna with general surgery who recommends admission for observation at [facility]. Patient in agreement with admission for ongoing evaluation.  Final Clinical Impression(s) / ED Diagnoses Final diagnoses:  None    Rx / DC Orders ED Discharge Orders      None        Tilden Fossa, MD 12/12/20 [phone]

## 2020-12-12 NOTE — Op Note (Signed)
12/12/2020  12:10 PM  PATIENT:  Linda Shelton  35 y.o. female  PRE-OPERATIVE DIAGNOSIS:  Intussusception of small bowel  POST-OPERATIVE DIAGNOSIS: normal small bowel and RYGB  PROCEDURE:  Procedure(s): EXPLORATORY LAPAROTOMY (N/A)  SURGEON:  Surgeon(s) and Role:    Axel Filler, MD - Primary  ANESTHESIA:   none  EBL:  min   BLOOD ADMINISTERED:none  DRAINS: none   LOCAL MEDICATIONS USED:  NONE  SPECIMEN:  No Specimen  DISPOSITION OF SPECIMEN:  N/A  COUNTS:  YES  TOURNIQUET:  * No tourniquets in log *  DICTATION: .Dragon Dictation Indication procedure: Patient is a 35 year old female with a history of a Roux-en-Y gastric bypass approximate 9 years ago.  Patient had 3 days of abdominal pain.  Patient was seen in outside ER was found to have signs consistent with intussusception at the jejunojejunostomy.  Patient with continued abdominal pain.  Patient was transferred here for urgent exploratory laparotomy.  Findings: Patient had normal post Roux-en-Y gastric bypass anatomy.  There was no intussusception that could be seen.  The area of the jejunojejunostomy appeared to be flat, without induration, without edema, without any signs of dilation or necrosis.  Her small bowel through the terminal ileum was normal-appearing.  There was some small noticeable lymph nodes in the mesentery however they were not overly inflamed.  The right colon, transverse colon, left colon and sigmoid colon were all normal.  Patient had a previous appendectomy.  Details of procedure: After the patient was consented she was taken back to the OR placed supine position bilateral SCDs placed.  Patient on general trach intubation.  Patient is a prepped draped standard fashion.  Time was called and all facts were verified.  This time an upper midline incision was made with #10 blade.  Dissection was taken down to the linea alba.  This was incised with cautery.  At this time the peritoneum was entered  sharply.  At this time the fascia was then extended to the length of the skin incision.  At this time I was able to eviscerate the small intestines.  The area of the jejunojejunostomy was immediately found.  There was no signs of dilation, intussusception, necrosis, or any other significant signs.  At this time I was able to trace back the jejunal limb to the gastric pouch.  This appeared to be normal.  I ran the small bowel from the jejunojejunostomy distally to the terminal ileum.  The small bowel appeared normal.  The mesentery of the small bowel did have several areas of noticeable lymph nodes however these did not appear to be inflamed.  It was apparent that the patient had a previous appendectomy.  The right colon, transverse colon, left colon and sigmoid colon were all visualized and seen to be without any pathology.  At this time the omentum was brought over the midline wound.  The midline fascia was reapproximated using a #1 PDS x2 in a standard running fashion.The skin was stapled together.  Honeycomb dressing was placed over the staple line.  Patient taught the procedure well was taken to the recovery in stable condition.     PLAN OF CARE: Admit for overnight observation  PATIENT DISPOSITION:  PACU - hemodynamically stable.   Delay start of Pharmacological VTE agent (>24hrs) due to surgical blood loss or risk of bleeding: no

## 2020-12-12 NOTE — H&P (Signed)
Linda Shelton is an 35 y.o. female.   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  Patient states that she had postoperative issues to include bacterial overgrowth and RA. Patient states that she had pain in the right lower quadrant area starting to 3 days ago.  She states that the pain was fairly benign and then got fairly sharp.  She states that its been her lower abdomen for most of the time.  She states she has had no nausea or vomiting.  She states that she has had a decreased appetite.  Patient underwent CT scan at outside ER which found an area of intussusception at the previous jejunojejunostomy.  Patient was then transferred to [facility] for further evaluation and management.  I did review her CT scan and laboratory studies personally.  Past Medical History:  Diagnosis Date   Anemia    Diabetes mellitus without complication (HCC)    Hypoglycemia    Pericarditis    Pleural effusion associated with pulmonary infection    RA (rheumatoid arthritis) ([place])     Past Surgical History:  Procedure Laterality Date   GASTRIC BYPASS     LAPAROSCOPIC APPENDECTOMY N/A 03/03/2019   Procedure: APPENDECTOMY LAPAROSCOPIC;  Surgeon: Stark Klein, MD;  Location: [place];  Service: General;  Laterality: N/A;   TUBAL LIGATION      History reviewed. No pertinent family history. Social History:  reports that she has never smoked. She has never used smokeless tobacco. She reports that she does not drink alcohol and does not use drugs.  Allergies:  Allergies  Allergen Reactions   Amoxicillin-Pot Clavulanate Other (See Comments)    Stomach upset/pain    Medications Prior to Admission  Medication Sig Dispense Refill   acetaminophen (TYLENOL) 325 MG tablet Take 2-3 tablets (650-975 mg total) by mouth every 6 (six) hours as needed for mild pain, moderate pain, fever or headache (or Fever >/= 101).      albuterol (VENTOLIN HFA) 108 (90 Base) MCG/ACT inhaler Inhale 1-2 puffs into the lungs every 6 (six) hours as needed for wheezing or shortness of breath. 6.7 g 0   ascorbic acid (VITAMIN C) 1000 MG tablet Take 1,000 mg by mouth daily.      cetirizine (ZYRTEC) 10 MG tablet Take 10 mg by mouth daily.     hydroxychloroquine (PLAQUENIL) 200 MG tablet Take 300 mg by mouth daily.      KRILL OIL PO Take 1 capsule by mouth daily.     NON FORMULARY xifaxin  Finished this morning     norelgestromin-ethinyl estradiol (ORTHO EVRA) 150-35 MCG/24HR transdermal patch Place 1 patch onto the skin once a week. (Patient not taking: Reported on 07/18/2019)     omeprazole (PRILOSEC) 20 MG capsule Take 20 mg by mouth daily as needed (for reflux).      oxyCODONE (OXY IR/ROXICODONE) 5 MG immediate release tablet Take 1 tablet (5 mg total) by mouth every 6 (six) hours as needed for moderate pain. 15 tablet 0   predniSONE (DELTASONE) 5 MG tablet Take 5 mg by mouth See admin instructions. Take 5 mg by mouth as needed for R.A.- every other day for 7 days at a time     Prenatal Vit-Fe Fumarate-FA (PRENATAL MULTIVITAMIN) TABS tablet Take 1 tablet by mouth daily at 12 noon.     sulfamethoxazole-trimethoprim (BACTRIM DS) 800-160 MG tablet Take 1 tablet by mouth 2 (two) times daily. 10  tablet 0   Vitamin D, Ergocalciferol, (DRISDOL) 1.25 MG (50000 UNIT) CAPS capsule Take 50,000 Units by mouth every Monday.       Results for orders placed or performed during the hospital encounter of 12/12/20 (from the past 48 hour(s))  Urinalysis, Routine w reflex microscopic Urine, Clean Catch     Status: None   Collection Time: 12/11/20 11:55 PM  Result Value Ref Range   Color, Urine YELLOW YELLOW   APPearance CLEAR CLEAR   Specific Gravity, Urine 1.020 1.005 - 1.030   pH 5.5 5.0 - 8.0   Glucose, UA NEGATIVE NEGATIVE mg/dL   Hgb urine dipstick NEGATIVE NEGATIVE   Bilirubin Urine NEGATIVE NEGATIVE   Ketones, ur NEGATIVE NEGATIVE mg/dL    Protein, ur NEGATIVE NEGATIVE mg/dL   Nitrite NEGATIVE NEGATIVE   Leukocytes,Ua NEGATIVE NEGATIVE    Comment: Microscopic not done on urines with negative protein, blood, leukocytes, nitrite, or glucose < 500 mg/dL. Performed at [facility], [address] [place], [place] [address]   Pregnancy, urine     Status: None   Collection Time: 12/11/20 11:55 PM  Result Value Ref Range   Preg Test, Ur NEGATIVE NEGATIVE    Comment:        THE SENSITIVITY OF THIS METHODOLOGY IS >20 mIU/mL. Performed at [facility], [address] [place], [place] [address]   Lipase, blood     Status: None   Collection Time: 12/12/20  2:23 AM  Result Value Ref Range   Lipase 31 11 - 51 U/L    Comment: Performed at [facility], [address] [place], [place] [address]  Comprehensive metabolic panel     Status: None   Collection Time: 12/12/20  2:23 AM  Result Value Ref Range   Sodium 136 135 - 145 mmol/L   Potassium 4.1 3.5 - 5.1 mmol/L   Chloride 103 98 - 111 mmol/L   CO2 23 22 - 32 mmol/L   Glucose, Bld 78 70 - 99 mg/dL    Comment: Glucose reference range applies only to samples taken after fasting for at least 8 hours.   BUN 14 6 - 20 mg/dL   Creatinine, Ser 0.64 0.44 - 1.00 mg/dL   Calcium 9.1 8.9 - 10.3 mg/dL   Total Protein 7.6 6.5 - 8.1 g/dL   Albumin 4.5 3.5 - 5.0 g/dL   AST 23 15 - 41 U/L   ALT 15 0 - 44 U/L   Alkaline Phosphatase 91 38 - 126 U/L   Total Bilirubin 0.5 0.3 - 1.2 mg/dL   GFR, Estimated >60 >60 mL/min    Comment: (NOTE) Calculated using the CKD-EPI Creatinine Equation (2021)    Anion gap 10 5 - 15    Comment: Performed at [facility], [address] [place], [place] [address]  CBC     Status: None   Collection Time: 12/12/20  2:23 AM  Result Value Ref Range   WBC 7.6 4.0 - 10.5 K/uL   RBC 4.18 3.87 - 5.11 MIL/uL   Hemoglobin 12.2 12.0 - 15.0 g/dL   HCT 36.7 36.0 - 46.0 %   MCV 87.8 80.0 - 100.0 fL   MCH 29.2 26.0 -  34.0 pg   MCHC 33.2 30.0 - 36.0 g/dL   RDW 12.9 11.5 - 15.5 %   Platelets 222 150 - 400 K/uL   nRBC 0.0 0.0 - 0.2 %    Comment: Performed  at [facility], [address] [place], [place] [address]  Resp Panel by RT-PCR (Flu A&B, Covid) Nasopharyngeal Swab     Status: None   Collection Time: 12/12/20  5:03 AM   Specimen: Nasopharyngeal Swab; Nasopharyngeal(NP) swabs in vial transport medium  Result Value Ref Range   SARS Coronavirus 2 by RT PCR NEGATIVE NEGATIVE    Comment: (NOTE) SARS-CoV-2 target nucleic acids are NOT DETECTED.  The SARS-CoV-2 RNA is generally detectable in upper respiratory specimens during the acute phase of infection. The lowest concentration of SARS-CoV-2 viral copies this assay can detect is 138 copies/mL. A negative result does not preclude SARS-Cov-2 infection and should not be used as the sole basis for treatment or other patient management decisions. A negative result may occur with  improper specimen collection/handling, submission of specimen other than nasopharyngeal swab, presence of viral mutation(s) within the areas targeted by this assay, and inadequate number of viral copies(<138 copies/mL). A negative result must be combined with clinical observations, patient history, and epidemiological information. The expected result is Negative.  Fact Sheet for Patients:  EntrepreneurPulse.com.au  Fact Sheet for Healthcare Providers:  IncredibleEmployment.be  This test is no t yet approved or cleared by the [place] FDA and  has been authorized for detection and/or diagnosis of SARS-CoV-2 by FDA under an Emergency Use Authorization (EUA). This EUA will remain  in effect (meaning this test can be used) for the duration of the COVID-19 declaration under Section 564(b)(1) of the Act, 21 U.S.C.section 360bbb-3(b)(1), unless the authorization is terminated  or revoked sooner.       Influenza A by PCR  NEGATIVE NEGATIVE   Influenza B by PCR NEGATIVE NEGATIVE    Comment: (NOTE) The Xpert Xpress SARS-CoV-2/FLU/RSV plus assay is intended as an aid in the diagnosis of influenza from Nasopharyngeal swab specimens and should not be used as a sole basis for treatment. Nasal washings and aspirates are unacceptable for Xpert Xpress SARS-CoV-2/FLU/RSV testing.  Fact Sheet for Patients: EntrepreneurPulse.com.au  Fact Sheet for Healthcare Providers: IncredibleEmployment.be  This test is not yet approved or cleared by the [place] FDA and has been authorized for detection and/or diagnosis of SARS-CoV-2 by FDA under an Emergency Use Authorization (EUA). This EUA will remain in effect (meaning this test can be used) for the duration of the COVID-19 declaration under Section 564(b)(1) of the Act, 21 U.S.C. section 360bbb-3(b)(1), unless the authorization is terminated or revoked.  Performed at [facility], [address], [place], [place] [address]    CT Abdomen Pelvis W Contrast  Result Date: 12/12/2020 CLINICAL DATA:  Acute abdominal pain right side. History of appendectomy. History of gastric bypass. Right lower quadrant pain for 2 days. EXAM: CT ABDOMEN AND PELVIS WITH CONTRAST TECHNIQUE: Multidetector CT imaging of the abdomen and pelvis was performed using the standard protocol following bolus administration of intravenous contrast. CONTRAST:  167mL OMNIPAQUE IOHEXOL 300 MG/ML  SOLN COMPARISON:  The CT without contrast 06/13/2020 and the CT with contrast 03/10/2020 FINDINGS: Lower chest: No acute abnormality. Hepatobiliary: 7 mm hyperdensity in the posterior segment of the right lobe of liver on series 2 axial 26 is again noted and probably a flash filling hemangioma. A faint hypodensity measuring 2 cm in the inferior aspect of the right lobe of the liver is also again noted with prior MRI 07/29/2019 demonstrating findings most consistent with  a hepatic adenoma. The liver is otherwise unremarkable. The gallbladder bile ducts unremarkable. Pancreas: Unremarkable. No  pancreatic ductal dilatation or surrounding inflammatory changes. Spleen: Normal in size without focal abnormality. Adrenals/Urinary Tract: There is no renal mass enhancement, calculus or hydronephrosis and no adrenal Mass. Stomach/Bowel: Old gastric bypass. The gastric wall unremarkable. There is dilatation of the small bowel anastomosis in the left mid abdomen due to a 5 cm telescoping of bowel through the surgical anastomosis with wall thickening in the intussusceptum. There is a mildly dilated short small-bowel segment just above this measuring 3.4 cm but the remainder of the small bowel is normal caliber. The appendix is absent. There is moderate stool retention. No evidence of colitis or diverticulitis. Vascular/Lymphatic: No significant vascular findings are present. No enlarged abdominal or pelvic lymph nodes. Reproductive: The uterus is intact. The adnexal areas are unremarkable. The ovaries are not enlarged. Other: There is no free air, hemorrhage or fluid Musculoskeletal: No acute or significant osseous findings. IMPRESSION: 1. 5 cm in length intussusception of the small bowel through an old small bowel surgical anastomosis in the left mid abdomen. There is a mildly dilated segment above the intussusception but the rest of the small bowel is normal caliber. There is wall thickening in the intussusceptum of unclear clinical significance. This could be due to a primary mucosal disease process or edema such as due to early ischemia. There is no wall pneumatosis. Consultation with a surgeon is recommended. 2. Redemonstration of prior findings in the liver. 3. Constipation. Electronically Signed   By: Telford Nab M.D.   On: 12/12/2020 03:48    Review of Systems  Constitutional:  Negative for chills and fever.  HENT:  Negative for ear discharge, hearing loss and sore throat.    Eyes:  Negative for discharge.  Respiratory:  Negative for cough and shortness of breath.   Cardiovascular:  Negative for chest pain and leg swelling.  Gastrointestinal:  Positive for abdominal pain. Negative for constipation, diarrhea, nausea and vomiting.  Musculoskeletal:  Negative for myalgias and neck pain.  Skin:  Negative for rash.  Allergic/Immunologic: Negative for environmental allergies.  Neurological:  Negative for dizziness and seizures.  Hematological:  Does not bruise/bleed easily.  Psychiatric/Behavioral:  Negative for suicidal ideas.   All other systems reviewed and are negative.  Blood pressure 101/68, pulse 78, temperature 97.9 F (36.6 C), temperature source Oral, resp. rate 16, height 5\' 3"  (1.6 m), weight 80.7 kg, SpO2 95 %. Physical Exam Constitutional:      Appearance: She is well-developed.     Comments: Conversant No acute distress  HENT:     Head: Normocephalic and atraumatic.  Eyes:     General: Lids are normal. No scleral icterus.    Pupils: Pupils are equal, round, and reactive to light.     Comments: Pupils are equal round and reactive No lid lag Moist conjunctiva  Neck:     Thyroid: No thyromegaly.     Trachea: No tracheal tenderness.     Comments: No cervical lymphadenopathy Cardiovascular:     Rate and Rhythm: Normal rate and regular rhythm.     Heart sounds: No murmur heard. Pulmonary:     Effort: Pulmonary effort is normal.     Breath sounds: Normal breath sounds. No wheezing or rales.  Abdominal:     Tenderness: There is abdominal tenderness in the right lower quadrant and left lower quadrant. There is no guarding or rebound.     Hernia: No hernia is present.  Musculoskeletal:     Cervical back: Normal range of motion and neck supple.  Skin:    General: Skin is warm.     Findings: No rash.     Nails: There is no clubbing.     Comments: Normal skin turgor  Neurological:     Mental Status: She is alert and oriented to person, place,  and time.     Comments: Normal gait and station  Psychiatric:        Mood and Affect: Mood normal.        Thought Content: Thought content normal.        Judgment: Judgment normal.     Comments: Appropriate affect     Assessment/Plan 35 year old female status post Roux-en-Y gastric bypass with intussusception at the jejunojejunostomy. 1.  We will proceed to the operating for emergent exploratory laparotomy, likely bowel resection and likely anastomosis. 2.  Discussed with the risk benefits of the procedure to include but not limited to: Infection, bleeding, damage structures, possible need for further surgery.  Patient was understanding wish to proceed.  Axel Filler, MD 12/12/2020, 11:06 AM

## 2020-12-12 NOTE — Anesthesia Procedure Notes (Signed)
Procedure Name: Intubation Date/Time: 12/12/2020 11:36 AM Performed by: Lissa Morales, CRNA Pre-anesthesia Checklist: Patient identified, Emergency Drugs available, Suction available and Patient being monitored Patient Re-evaluated:Patient Re-evaluated prior to induction Oxygen Delivery Method: Circle system utilized Preoxygenation: Pre-oxygenation with 100% oxygen Induction Type: IV induction, Rapid sequence and Cricoid Pressure applied Laryngoscope Size: Mac and 4 Grade View: Grade I Tube type: Oral Tube size: 7.0 mm Number of attempts: 1 Airway Equipment and Method: Stylet and Oral airway Placement Confirmation: ETT inserted through vocal cords under direct vision, positive ETCO2 and breath sounds checked- equal and bilateral Secured at: 21 cm Tube secured with: Tape Dental Injury: Teeth and Oropharynx as per pre-operative assessment

## 2020-12-12 NOTE — Anesthesia Preprocedure Evaluation (Signed)
Anesthesia Evaluation  Patient identified by MRN, date of birth, ID band Patient awake    Reviewed: Allergy & Precautions, NPO status , Patient's Chart, lab work & pertinent test results  Airway Mallampati: II  TM Distance: >3 FB Neck ROM: Full    Dental no notable dental hx.    Pulmonary    Pulmonary exam normal breath sounds clear to auscultation       Cardiovascular Normal cardiovascular exam Rhythm:Regular Rate:Normal     Neuro/Psych    GI/Hepatic Neg liver ROS, History noted CG   Endo/Other  negative endocrine ROS  Renal/GU negative Renal ROS     Musculoskeletal  (+) Arthritis , Rheumatoid disorders,    Abdominal (+) + obese,   Peds  Hematology  (+) anemia ,   Anesthesia Other Findings Intussusception  Reproductive/Obstetrics                             Anesthesia Physical  Anesthesia Plan  ASA: 3 and emergent  Anesthesia Plan: General   Post-op Pain Management:    Induction: Intravenous  PONV Risk Score and Plan: 3 and Ondansetron, Dexamethasone, Midazolam and Treatment may vary due to age or medical condition  Airway Management Planned: Oral ETT  Additional Equipment:   Intra-op Plan:   Post-operative Plan: Extubation in OR  Informed Consent: I have reviewed the patients History and Physical, chart, labs and discussed the procedure including the risks, benefits and alternatives for the proposed anesthesia with the patient or authorized representative who has indicated his/her understanding and acceptance.     Dental advisory given  Plan Discussed with: CRNA and Anesthesiologist  Anesthesia Plan Comments:         Anesthesia Quick Evaluation

## 2020-12-12 NOTE — ED Notes (Signed)
[facility] arrived; SBAR provided.

## 2020-12-12 NOTE — Transfer of Care (Signed)
Immediate Anesthesia Transfer of Care Note  Patient: Linda Shelton  Procedure(s) Performed: EXPLORATORY LAPAROTOMY  Patient Location: PACU  Anesthesia Type:General  Level of Consciousness: awake and patient cooperative  Airway & Oxygen Therapy: Patient Spontanous Breathing and Patient connected to face mask oxygen  Post-op Assessment: Report given to RN, Post -op Vital signs reviewed and stable and Patient moving all extremities X 4  Post vital signs: stable  Last Vitals:  Vitals Value Taken Time  BP 108/76 12/12/20 1231  Temp 36.8 C 12/12/20 1225  Pulse 64 12/12/20 1233  Resp 14 12/12/20 1233  SpO2 100 % 12/12/20 1233  Vitals shown include unvalidated device data.  Last Pain:  Vitals:   12/12/20 1225  TempSrc:   PainSc: Asleep      Patients Stated Pain Goal: 0 (12/12/20 0934)  Complications: No notable events documented.

## 2020-12-13 ENCOUNTER — Encounter (HOSPITAL_COMMUNITY): Payer: Self-pay | Admitting: General Surgery

## 2020-12-13 LAB — BASIC METABOLIC PANEL
Anion gap: 7 (ref 5–15)
BUN: 7 mg/dL (ref 6–20)
CO2: 25 mmol/L (ref 22–32)
Calcium: 9 mg/dL (ref 8.9–10.3)
Chloride: 104 mmol/L (ref 98–111)
Creatinine, Ser: 0.57 mg/dL (ref 0.44–1.00)
GFR, Estimated: >60 mL/min (ref >60)
Glucose, Bld: 143 mg/dL — ABNORMAL HIGH (ref 70–99)
Potassium: 3.8 mmol/L (ref 3.5–5.1)
Sodium: 136 mmol/L (ref 135–145)

## 2020-12-13 MED ORDER — TRAMADOL HCL 50 MG PO TABS
50.0000 mg | ORAL_TABLET | Freq: Four times a day (QID) | ORAL | Status: DC | PRN
  Administered 2020-12-13 – 2020-12-14 (×3): 50 mg via ORAL
  Filled 2020-12-13 (×3): qty 1

## 2020-12-13 MED ORDER — HYDROXYCHLOROQUINE SULFATE 200 MG PO TABS
300.0000 mg | ORAL_TABLET | Freq: Every day | ORAL | Status: DC
  Administered 2020-12-13 – 2020-12-15 (×3): 300 mg via ORAL
  Filled 2020-12-13 (×3): qty 1.5

## 2020-12-13 MED ORDER — DIAZEPAM 5 MG PO TABS: 5.0000 mg | ORAL_TABLET | Freq: Four times a day (QID) | ORAL | Status: DC | PRN

## 2020-12-13 NOTE — Progress Notes (Signed)
1 Day Post-Op   Subjective/Chief Complaint: Pt doing well this AM Some abd soreness    Objective: Vital signs in last 24 hours: Temp:  [98.1 F (36.7 C)-98.5 F (36.9 C)] 98.4 F (36.9 C) (11/20 0623) Pulse Rate:  [57-82] 72 (11/20 0623) Resp:  [10-20] 18 (11/20 0623) BP: (99-125)/(54-76) 102/64 (11/20 0623) SpO2:  [96 %-100 %] 99 % (11/20 0623) Last BM Date: 12/11/20  Intake/Output from previous day: 11/19 0701 - 11/20 0700 In: 2325 [P.O.:625; I.V.:1400; IV Piggyback:300] Out: 1001 [Urine:1001] Intake/Output this shift: No intake/output data recorded.  General appearance: alert and cooperative GI: soft, non-tender; bowel sounds normal; no masses,  no organomegaly and inc c /d/i  Lab Results:  Recent Labs    12/12/20 0223  WBC 7.6  HGB 12.2  HCT 36.7  PLT 222   BMET Recent Labs    12/12/20 0223 12/13/20 0424  NA 136 136  K 4.1 3.8  CL 103 104  CO2 23 25  GLUCOSE 78 143*  BUN 14 7  CREATININE 0.64 0.57  CALCIUM 9.1 9.0   PT/INR No results for input(s): LABPROT, INR in the last 72 hours. ABG No results for input(s): PHART, HCO3 in the last 72 hours.  Invalid input(s): PCO2, PO2  Studies/Results: CT Abdomen Pelvis W Contrast  Result Date: 12/12/2020 CLINICAL DATA:  Acute abdominal pain right side. History of appendectomy. History of gastric bypass. Right lower quadrant pain for 2 days. EXAM: CT ABDOMEN AND PELVIS WITH CONTRAST TECHNIQUE: Multidetector CT imaging of the abdomen and pelvis was performed using the standard protocol following bolus administration of intravenous contrast. CONTRAST:  OMNIPAQUE IOHEXOL 300 MG/ML  SOLN COMPARISON:  The CT without contrast 06/13/2020 and the CT with contrast 03/10/2020 FINDINGS: Lower chest: No acute abnormality. Hepatobiliary: 7 mm hyperdensity in the posterior segment of the right lobe of liver on series 2 axial 26 is again noted and probably a flash filling hemangioma. A faint hypodensity measuring 2 cm in  the inferior aspect of the right lobe of the liver is also again noted with prior MRI 07/29/2019 demonstrating findings most consistent with a hepatic adenoma. The liver is otherwise unremarkable. The gallbladder bile ducts unremarkable. Pancreas: Unremarkable. No pancreatic ductal dilatation or surrounding inflammatory changes. Spleen: Normal in size without focal abnormality. Adrenals/Urinary Tract: There is no renal mass enhancement, calculus or hydronephrosis and no adrenal Mass. Stomach/Bowel: Old gastric bypass. The gastric wall unremarkable. There is dilatation of the small bowel anastomosis in the left mid abdomen due to a 5 cm telescoping of bowel through the surgical anastomosis with wall thickening in the intussusceptum. There is a mildly dilated short small-bowel segment just above this measuring 3.4 cm but the remainder of the small bowel is normal caliber. The appendix is absent. There is moderate stool retention. No evidence of colitis or diverticulitis. Vascular/Lymphatic: No significant vascular findings are present. No enlarged abdominal or pelvic lymph nodes. Reproductive: The uterus is intact. The adnexal areas are unremarkable. The ovaries are not enlarged. Other: There is no free air, hemorrhage or fluid Musculoskeletal: No acute or significant osseous findings. IMPRESSION: 1. 5 cm in length intussusception of the small bowel through an old small bowel surgical anastomosis in the left mid abdomen. There is a mildly dilated segment above the intussusception but the rest of the small bowel is normal caliber. There is wall thickening in the intussusceptum of unclear clinical significance. This could be due to a primary mucosal disease process or edema such as due  to early ischemia. There is no wall pneumatosis. Consultation with a surgeon is recommended. 2. Redemonstration of prior findings in the liver. 3. Constipation. Electronically Signed   By: Telford Nab M.D.   On: 12/12/2020 03:48     Anti-infectives: Anti-infectives (From admission, onward)    Start     Dose/Rate Route Frequency Ordered Stop   12/12/20 1515  hydroxychloroquine (PLAQUENIL) tablet 300 mg  Status:  Discontinued        300 mg Oral Daily 12/12/20 1427 12/12/20 1504   12/12/20 1515  sulfamethoxazole-trimethoprim (BACTRIM DS) 800-160 MG per tablet 1 tablet  Status:  Discontinued        1 tablet Oral 2 times daily 12/12/20 1427 12/12/20 1504   12/12/20 1215  ciprofloxacin (CIPRO) IVPB 400 mg        400 mg 200 mL/hr over 60 Minutes Intravenous  Once 12/12/20 1206 12/12/20 1137   12/12/20 1215  metroNIDAZOLE (FLAGYL) IVPB 500 mg        500 mg 100 mL/hr over 60 Minutes Intravenous  Once 12/12/20 1207 12/12/20 1137   12/12/20 1117  ciprofloxacin (CIPRO) 400 MG/200ML IVPB       Note to Pharmacy: Enrigue Catena   : cabinet override      12/12/20 1117 12/12/20 1155   12/12/20 1116  metroNIDAZOLE (FLAGYL) 500 MG/100ML IVPB       Note to Pharmacy: Enrigue Catena   : cabinet override      12/12/20 1116 12/12/20 1155       Assessment/Plan: s/p Procedure(s): EXPLORATORY LAPAROTOMY (N/A) Advance diet as tol Mobilize today Pain control Home in next 1-2d  LOS: 0 days    Ralene Ok 12/13/2020

## 2020-12-14 MED ORDER — TRAMADOL HCL 50 MG PO TABS
50.0000 mg | ORAL_TABLET | ORAL | Status: DC | PRN
Start: 2020-12-14 — End: 2020-12-15
  Administered 2020-12-14 – 2020-12-15 (×4): 100 mg via ORAL
  Filled 2020-12-14 (×4): qty 2

## 2020-12-14 MED ORDER — FENTANYL CITRATE PF 50 MCG/ML IJ SOSY: 12.5000 ug | PREFILLED_SYRINGE | INTRAMUSCULAR | Status: DC | PRN

## 2020-12-14 MED ORDER — OXYCODONE HCL 5 MG PO TABS: 5.0000 mg | ORAL_TABLET | ORAL | Status: DC | PRN

## 2020-12-14 NOTE — Progress Notes (Signed)
2 Days Post-Op   Subjective/Chief Complaint: Pt with better abd pain + flatus   Objective: Vital signs in last 24 hours: Temp:  [98.1 F (36.7 C)-98.2 F (36.8 C)] 98.1 F (36.7 C) (11/20 2032) Pulse Rate:  [67-72] 72 (11/21 0604) Resp:  [16-18] 18 (11/21 0604) BP: (101-113)/(59-72) 104/72 (11/21 0604) SpO2:  [99 %-100 %] 99 % (11/21 0604) Last BM Date: 12/11/20  Intake/Output from previous day: 11/20 0701 - 11/21 0700 In: 3957 [P.O.:1680; I.V.:2277] Out: 2000 [Urine:2000] Intake/Output this shift: No intake/output data recorded.  General appearance: alert and cooperative GI: soft, non-tender; bowel sounds normal; no masses,  no organomegaly and inc c/d/i  Lab Results:  Recent Labs    12/12/20 0223  WBC 7.6  HGB 12.2  HCT 36.7  PLT 222   BMET Recent Labs    12/12/20 0223 12/13/20 0424  NA 136 136  K 4.1 3.8  CL 103 104  CO2 23 25  GLUCOSE 78 143*  BUN 14 7  CREATININE 0.64 0.57  CALCIUM 9.1 9.0   PT/INR No results for input(s): LABPROT, INR in the last 72 hours. ABG No results for input(s): PHART, HCO3 in the last 72 hours.  Invalid input(s): PCO2, PO2  Studies/Results: No results found.  Anti-infectives: Anti-infectives (From admission, onward)    Start     Dose/Rate Route Frequency Ordered Stop   12/13/20 1115  hydroxychloroquine (PLAQUENIL) tablet 300 mg        300 mg Oral Daily 12/13/20 1028     12/12/20 1515  hydroxychloroquine (PLAQUENIL) tablet 300 mg  Status:  Discontinued        300 mg Oral Daily 12/12/20 1427 12/12/20 1504   12/12/20 1515  sulfamethoxazole-trimethoprim (BACTRIM DS) 800-160 MG per tablet 1 tablet  Status:  Discontinued        1 tablet Oral 2 times daily 12/12/20 1427 12/12/20 1504   12/12/20 1215  ciprofloxacin (CIPRO) IVPB 400 mg        400 mg 200 mL/hr over 60 Minutes Intravenous  Once 12/12/20 1206 12/12/20 1137   12/12/20 1215  metroNIDAZOLE (FLAGYL) IVPB 500 mg        500 mg 100 mL/hr over 60 Minutes  Intravenous  Once 12/12/20 1207 12/12/20 1137   12/12/20 1117  ciprofloxacin (CIPRO) 400 MG/200ML IVPB       Note to Pharmacy: Anastasio Champion   : cabinet override      12/12/20 1117 12/12/20 1155   12/12/20 1116  metroNIDAZOLE (FLAGYL) 500 MG/100ML IVPB       Note to Pharmacy: Anastasio Champion   : cabinet override      12/12/20 1116 12/12/20 1155       Assessment/Plan: s/p Procedure(s): EXPLORATORY LAPAROTOMY (N/A) Advance diet Home later today if feeling well  LOS: 0 days    Linda Shelton 12/14/2020

## 2020-12-15 MED ORDER — TRAMADOL HCL 50 MG PO TABS: 100.0000 mg | ORAL_TABLET | Freq: Four times a day (QID) | ORAL | 0 refills | Status: AC | PRN

## 2020-12-15 MED ORDER — METHOCARBAMOL 500 MG PO TABS: 500.0000 mg | ORAL_TABLET | Freq: Four times a day (QID) | ORAL | 0 refills | Status: AC | PRN

## 2020-12-15 NOTE — Progress Notes (Signed)
Discharge packet given tp pt, discharge instructions given to pt. All questions answered, pt verbalized understanding. IV removed, abdominal binder provided to pt. Pt taken downstairs via wheelchair by staff.

## 2020-12-15 NOTE — TOC Transition Note (Signed)
Transition of Care [facility]) - CM/SW Discharge Note   Patient Details  Name: Linda Shelton MRN: [id number] Date of Birth: 1985/03/29  [facility]) CM/SW Contact:  Coralyn Helling, LCSW Phone Number: 12/15/2020, 12:32 PM   Clinical Narrative:   TOC notified at dc via secure chat that patient is ready and needs DME at dc. LCSW ordered DME and it will delivered to patient's room.   Coralyn Helling, LSCW [facility] CSW Transitions of Care, Relief    Final next level of care: Home/Self Care Barriers to Discharge: No Barriers Identified   Patient Goals and CMS Choice     Choice offered to / list presented to : NA  Discharge Placement                       Discharge Plan and Services                DME Arranged: 3-N-1, Dan Humphreys DME Agency: [employer] Date DME Agency Contacted: 12/15/20 Time DME Agency Contacted: [phone] Representative spoke with at DME Agency: Barbette Or            Social Determinants of Health (SDOH) Interventions     Readmission Risk Interventions No flowsheet data found.

## 2020-12-15 NOTE — Discharge Summary (Signed)
[facility] Discharge Summary   Patient ID: Linda Shelton MRN: [id number] DOB/AGE: 1985-10-24 35 y.o.  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  Procedures Dr. Derrell Lolling (12/12/20) - exploratory laparotomy  Hospital Course:  35 year old female who presented to [facility] ED with right lower quadrant pain beginning 3 days prior.  Workup showed including  CT scan at outside ER concerning for an area of intussusception at the previous jejunojejunostomy.  Patient was admitted and underwent procedure listed above.  Tolerated procedure well and was transferred to the floor.  Operation was negative for intussusception. Diet was advanced as tolerated.  On POD3, the patient was voiding well, tolerating diet, ambulating well, pain well controlled, vital signs stable, incisions c/d/i and felt stable for discharge home.  She did have worsening of her baseline RA pain during admission and worked with PT who recommend rolling walker and 3 n 1 at discharge. Patient will follow up in our office in 3-4 weeks and knows to call with questions or concerns.    She was discharged with tramadol for pain control which she stated works better for her than oxycodone  I or a member of my team have reviewed this patient in the Controlled Substance Database. Allergies as of 12/15/2020       Reactions   Amoxicillin-pot Clavulanate Other (See Comments)   Stomach upset/pain        Medication List     STOP taking these medications    oxyCODONE 5 MG immediate release tablet Commonly known as: Oxy IR/ROXICODONE       TAKE these medications    acetaminophen 325 MG tablet Commonly known as: TYLENOL Take  2-3 tablets (650-975 mg total) by mouth every 6 (six) hours as needed for mild pain, moderate pain, fever or headache (or Fever >/= 101).   Adalimumab 40 MG/0.4ML Pnkt Inject 40 mg into the skin every 14 (fourteen) days.   albuterol 108 (90 Base) MCG/ACT inhaler Commonly known as: VENTOLIN HFA Inhale 1-2 puffs into the lungs every 6 (six) hours as needed for wheezing or shortness of breath.   ascorbic acid 1000 MG tablet Commonly known as: VITAMIN C Take 1,000 mg by mouth daily.   cetirizine 10 MG tablet Commonly known as: ZYRTEC Take 10 mg by mouth daily.   hydroxychloroquine 200 MG tablet Commonly known as: PLAQUENIL Take 300 mg by mouth daily.   KRILL OIL PO Take 1 capsule by mouth daily.   methocarbamol 500 MG tablet Commonly known as: Robaxin Take 1 tablet (500 mg total) by mouth every 6 (six) hours as needed for up to 5 days for muscle spasms.   NON FORMULARY xifaxin  Finished this morning   norelgestromin-ethinyl estradiol 150-35 MCG/24HR transdermal patch Commonly known as: ORTHO EVRA Place 1 patch onto the skin once a week.   omeprazole 20 MG capsule Commonly known as: PRILOSEC Take 20 mg by mouth daily as needed (for reflux).   predniSONE 5 MG tablet Commonly known as: DELTASONE Take 5 mg by mouth See admin instructions. Take 5 mg by mouth as needed for R.A.- every other day for 7 days at a time   prenatal multivitamin Tabs tablet Take 1 tablet by mouth daily at 12 noon.   traMADol 50 MG tablet Commonly known as:  ULTRAM Take 2 tablets (100 mg total) by mouth every 6 (six) hours as needed for up to 5 days for severe pain or moderate pain (can use 50 mg (1 tablet) first for moderate pain. 100 mg (2 tablets) for severe pain. Do NOT take more than 2 tablets every 6 hours).   Vitamin D (Ergocalciferol) 1.25 MG (50000 UNIT) Caps capsule Commonly known as: DRISDOL Take 50,000 Units by mouth every Monday.               Durable Medical Equipment  (From  admission, onward)           Start     Ordered   12/15/20 1205  For home use only DME Walker  Once       Question Answer Comment  Patient needs a walker to treat with the following condition Rheumatoid arthritis [facility])   Patient needs a walker to treat with the following condition H/O abdominal surgery      12/15/20 1204   12/15/20 1205  For home use only DME 3 n 1  Once        12/15/20 1204              Follow-up Information     [facility] Go on 12/24/2020.   Specialty: General Surgery Why: Your appointment is 12/1 at 9am for staple removal Please arrive 30 minutes early to check in and fill out paperwork. Bring photo ID and insurance information. Contact information: [address] [place] [address] [phone]        Axel Filler, MD. Go on 01/07/2021.   Specialty: General Surgery Why: Your appointment is 12/15 at 11:50am Please arrive 15 minutes early to check in. Contact information: [address] [place] [address] [phone]                 Signed: Eric Form [facility] 12/15/2020, 2:36 PM Please see Amion for pager number during day hours 7:00am-4:30pm

## 2020-12-15 NOTE — Evaluation (Addendum)
Physical Therapy Evaluation Patient Details Name: Linda Shelton MRN: [id number] DOB: 1985-05-20 Today's Date: 12/15/2020  History of Present Illness  35 yo female admitted with intussusception intestine s/p ex lap 11/19. Hx of RA, gastric bypass 2013, DM, anemia, pericarditis  Clinical Impression  On eval, pt was Min guard assist for mobility. She walked ~75 feet in the hallway. Used RW for some support (pt tends to hold abdomen with 1 hand to support it), thus having only 1 hand on walker handle. Discussed whether abd binder may offer some support-will secure chat surgery PA to see if  they are okay with her wearing one-I placed order for abdominal binder after getting okay from [place], [place]. Will recommend RW and 3n1 for home. Encouraged pt to ambulate often.      Recommendations for follow up therapy are one component of a multi-disciplinary discharge planning process, led by the attending physician.  Recommendations may be updated based on patient status, additional functional criteria and insurance authorization.  Follow Up Recommendations No PT follow up    Assistance Recommended at Discharge Intermittent Supervision/Assistance  Functional Status Assessment Patient has had a recent decline in their functional status and demonstrates the ability to make significant improvements in function in a reasonable and predictable amount of time.  Equipment Recommendations  Rolling walker (2 wheels);BSC/3in1    Recommendations for Other Services       Precautions / Restrictions Precautions Precaution Comments: some lightheadedness when mobilizing Restrictions Weight Bearing Restrictions: No      Mobility  Bed Mobility Overal bed mobility: Modified Independent             General bed mobility comments: HOB elevated. Pt used bedrails. She stated she is planning to sleep in a recliner    Transfers Overall transfer level: Needs assistance Equipment used: None Transfers: Sit  to/from Stand Sit to Stand: Supervision           General transfer comment: Supv for safety. Increased time.    Ambulation/Gait Ambulation/Gait assistance: Min guard Gait Distance (Feet): 75 Feet Assistive device: Rolling walker (2 wheels) Gait Pattern/deviations: Step-through pattern;Decreased stride length       General Gait Details: Min guard for safety. Pt tended to only use 1 hand on RW handle (other hand was supporting abdomen). Slow gait speed. Some lightheadedness but able to ambulate. Cued pt to avoid fast head turns  Stairs            Wheelchair Mobility    Modified Rankin (Stroke Patients Only)       Balance Overall balance assessment: Needs assistance         Standing balance support: During functional activity;Reliant on assistive device for balance Standing balance-Leahy Scale: Poor                               Pertinent Vitals/Pain Pain Assessment: 0-10 Pain Score: 8  Pain Location: abdomen Pain Descriptors / Indicators: Discomfort;Sore Pain Intervention(s): Limited activity within patient's tolerance;Monitored during session    Home Living Family/patient expects to be discharged to:: Private residence Living Arrangements: Spouse/significant other;Children (spouse out of country working until next Tuesday) Available Help at Discharge: Family;Available PRN/intermittently Type of Home: Apartment Home Access: Level entry       Home Layout: One level Home Equipment: None      Prior Function Prior Level of Function : Independent/Modified Independent  Hand Dominance        Extremity/Trunk Assessment   Upper Extremity Assessment Upper Extremity Assessment: Overall WFL for tasks assessed (some discomfort and stiffness 2* RA and having to use UEs so much to mobilize)    Lower Extremity Assessment Lower Extremity Assessment: Overall WFL for tasks assessed    Cervical / Trunk  Assessment Cervical / Trunk Assessment: Normal (some discomfort and stiffness in neck 2* RA)  Communication   Communication: No difficulties  Cognition Arousal/Alertness: Awake/alert Behavior During Therapy: WFL for tasks assessed/performed Overall Cognitive Status: Within Functional Limits for tasks assessed                                          General Comments      Exercises     Assessment/Plan    PT Assessment Patient needs continued PT services  PT Problem List Decreased mobility;Decreased activity tolerance;Decreased balance;Decreased knowledge of use of DME;Pain       PT Treatment Interventions DME instruction;Gait training;Therapeutic activities;Therapeutic exercise;Patient/family education;Functional mobility training;Balance training    PT Goals (Current goals can be found in the Care Plan section)  Acute Rehab PT Goals Patient Stated Goal: less pain. PT Goal Formulation: With patient Time For Goal Achievement: 12/29/20 Potential to Achieve Goals: Good    Frequency Min 3X/week   Barriers to discharge        Co-evaluation               AM-PAC PT "6 Clicks" Mobility  Outcome Measure Help needed turning from your back to your side while in a flat bed without using bedrails?: A Little Help needed moving from lying on your back to sitting on the side of a flat bed without using bedrails?: A Little Help needed moving to and from a bed to a chair (including a wheelchair)?: A Little Help needed standing up from a chair using your arms (e.g., wheelchair or bedside chair)?: A Little Help needed to walk in hospital room?: A Little Help needed climbing 3-5 steps with a railing? : A Lot 6 Click Score: 17    End of Session   Activity Tolerance: Patient tolerated treatment well;Patient limited by pain Patient left: in bed;with call bell/phone within reach   PT Visit Diagnosis: Difficulty in walking, not elsewhere classified (R26.2);Pain     Time: [id number] PT Time Calculation (min) (ACUTE ONLY): 32 min   Charges:   PT Evaluation $PT Eval Moderate Complexity: 1 Mod PT Treatments $Gait Training: 8-22 mins           Doreatha Massed, PT Acute Rehabilitation  Office: [phone] Pager: [phone]

## 2020-12-15 NOTE — Progress Notes (Signed)
3 Days Post-Op   Subjective/Chief Complaint: Still with some abdominal pain but RA pain is most bothersome in her neck and shoulders. Tolerating diet. Passing flatus. No BM. She states tramadol works better than oxycodone for her pain  Objective: Vital signs in last 24 hours: Temp:  [98.2 F (36.8 C)-98.6 F (37 C)] 98.2 F (36.8 C) (11/22 0518) Pulse Rate:  [70-80] 70 (11/22 0518) Resp:  [16-18] 18 (11/22 0518) BP: (100-109)/(58-71) 101/58 (11/22 0518) SpO2:  [98 %-100 %] 98 % (11/22 0518) Last BM Date: 12/11/20  Intake/Output from previous day: 11/21 0701 - 11/22 0700 In: 1795.5 [P.O.:1560; I.V.:235.5] Out: 2900 [Urine:2900] Intake/Output this shift: No intake/output data recorded.  General appearance: alert and cooperative GI: soft, non-tender; bowel sounds normal; no masses,  no organomegaly and incisions c/d/i  Lab Results:  No results for input(s): WBC, HGB, HCT, PLT in the last 72 hours.  BMET Recent Labs    12/13/20 0424  NA 136  K 3.8  CL 104  CO2 25  GLUCOSE 143*  BUN 7  CREATININE 0.57  CALCIUM 9.0    PT/INR No results for input(s): LABPROT, INR in the last 72 hours. ABG No results for input(s): PHART, HCO3 in the last 72 hours.  Invalid input(s): PCO2, PO2  Studies/Results: No results found.  Anti-infectives: Anti-infectives (From admission, onward)    Start     Dose/Rate Route Frequency Ordered Stop   12/13/20 1115  hydroxychloroquine (PLAQUENIL) tablet 300 mg        300 mg Oral Daily 12/13/20 1028     12/12/20 1515  hydroxychloroquine (PLAQUENIL) tablet 300 mg  Status:  Discontinued        300 mg Oral Daily 12/12/20 1427 12/12/20 1504   12/12/20 1515  sulfamethoxazole-trimethoprim (BACTRIM DS) 800-160 MG per tablet 1 tablet  Status:  Discontinued        1 tablet Oral 2 times daily 12/12/20 1427 12/12/20 1504   12/12/20 1215  ciprofloxacin (CIPRO) IVPB 400 mg        400 mg 200 mL/hr over 60 Minutes Intravenous  Once 12/12/20 1206 12/12/20  1137   12/12/20 1215  metroNIDAZOLE (FLAGYL) IVPB 500 mg        500 mg 100 mL/hr over 60 Minutes Intravenous  Once 12/12/20 1207 12/12/20 1137   12/12/20 1117  ciprofloxacin (CIPRO) 400 MG/200ML IVPB       Note to Pharmacy: Anastasio Champion   : cabinet override      12/12/20 1117 12/12/20 1155   12/12/20 1116  metroNIDAZOLE (FLAGYL) 500 MG/100ML IVPB       Note to Pharmacy: Anastasio Champion   : cabinet override      12/12/20 1116 12/12/20 1155       Assessment/Plan: s/p Procedure(s): EXPLORATORY LAPAROTOMY (N/A) - continue soft diet - home today    LOS: 0 days   Eric Form, [facility] 12/15/2020, 10:48 AM Please see Amion for pager number during day hours 7:00am-4:30pm

## 2020-12-15 NOTE — Discharge Instructions (Signed)
[facility]      [facility], [place] [phone]  OPEN ABDOMINAL SURGERY: POST OP INSTRUCTIONS  Always review your discharge instruction sheet given to you by the facility where your surgery was performed.  IF YOU HAVE DISABILITY OR FAMILY LEAVE FORMS, YOU MUST BRING THEM TO THE OFFICE FOR PROCESSING.  PLEASE DO NOT GIVE THEM TO YOUR DOCTOR.  A prescription for pain medication may be given to you upon discharge.  Take your pain medication as prescribed, if needed.  If narcotic pain medicine is not needed, then you may take acetaminophen (Tylenol) or ibuprofen (Advil) as needed. Take your usually prescribed medications unless otherwise directed. If you need a refill on your pain medication, please contact your pharmacy. They will contact our office to request authorization.  Prescriptions will not be filled after 5pm or on week-ends. You should follow a light diet the first few days after arrival home, such as soup and crackers, pudding, etc.unless your doctor has advised otherwise. A high-fiber, low fat diet can be resumed as tolerated.   Be sure to include lots of fluids daily. Most patients will experience some swelling and bruising on the chest and neck area.  Ice packs will help.  Swelling and bruising can take several days to resolve Most patients will experience some swelling and bruising in the area of the incision. Ice pack will help. Swelling and bruising can take several days to resolve..  It is common to experience some constipation if taking pain medication after surgery.  Increasing fluid intake and taking a stool softener will usually help or prevent this problem from occurring.  A mild laxative (Milk of Magnesia or Miralax) should be taken according to package directions if there are no bowel movements after 48 hours.  You may have steri-strips (small skin tapes) in place directly over the incision.  These strips should be left on the skin for 7-10 days.  If your surgeon used skin  glue on the incision, you may shower in 24 hours.  The glue will flake off over the next 2-3 weeks.  Any sutures or staples will be removed at the office during your follow-up visit. You may find that a light gauze bandage over your incision may keep your staples from being rubbed or pulled. You may shower and replace the bandage daily. ACTIVITIES:  You may resume regular (light) daily activities beginning the next day--such as daily self-care, walking, climbing stairs--gradually increasing activities as tolerated.  You may have sexual intercourse when it is comfortable.  Refrain from any heavy lifting or straining until approved by your doctor- no lifting greater than 10 pounds for 6 weeks after surgery You may drive when you no longer are taking prescription pain medication, you can comfortably wear a seatbelt, and you can safely maneuver your car and apply brakes You should see your doctor in the office for a follow-up appointment approximately two-four  weeks after your surgery.  Make sure that you call for this appointment within a day or two after you arrive home to insure a convenient appointment time. OTHER INSTRUCTIONS:  __you may shower 48 hours from surgery. No bathing or swimming until skin is healed___________________________________________________________ _____________________________________________________________  WHEN TO CALL YOUR DOCTOR: Fever over 101.0 Inability to urinate Nausea and/or vomiting Extreme swelling or bruising Continued bleeding from incision. Increased pain, redness, or drainage from the incision. Difficulty swallowing or breathing Muscle cramping or spasms. Numbness or tingling in hands or feet or around lips.  The clinic staff is  available to answer your questions during regular business hours.  Please don't hesitate to call and ask to speak to one of the nurses if you have concerns.  For further questions, please visit [email or web address]

## 2020-12-20 ENCOUNTER — Emergency Department (HOSPITAL_COMMUNITY)
Admission: EM | Admit: 2020-12-20 | Discharge: 2020-12-20 | Disposition: A | Discharge status: Home / Self Care | Payer: Medicaid Other | Attending: Emergency Medicine | Admitting: Emergency Medicine

## 2020-12-20 ENCOUNTER — Encounter (HOSPITAL_COMMUNITY): Payer: Self-pay | Admitting: Emergency Medicine

## 2020-12-20 DIAGNOSIS — E119 Type 2 diabetes mellitus without complications: Secondary | ICD-10-CM | POA: Insufficient documentation

## 2020-12-20 DIAGNOSIS — Z4801 Encounter for change or removal of surgical wound dressing: Secondary | ICD-10-CM | POA: Insufficient documentation

## 2020-12-20 DIAGNOSIS — Z4889 Encounter for other specified surgical aftercare: Secondary | ICD-10-CM

## 2020-12-20 NOTE — ED Notes (Signed)
Pt has surgical dressing to abdomen, no obvious dehiscence seen by this Clinical research associate. Will defer removing dressing to EDP.

## 2020-12-20 NOTE — ED Provider Notes (Signed)
[facility] DEPT Provider Note   CSN: [id number] Arrival date & time: 12/20/20  1428     History Chief Complaint  Patient presents with   Wound Dehiscence    Linda Shelton is a 35 y.o. female with a past medical history of diabetes who presents to the ED complaining of wound dehiscence onset last night.  She notes that she had exploratory lap completed 1 week ago.  She has adequate follow-up with her surgeon within the next week.  Patient is not tried any medications for his symptoms. She denies fever, chills, abdominal pain, nausea, vomiting, or color change.    The history is provided by the patient. No language interpreter was used.      Past Medical History:  Diagnosis Date   Anemia    Diabetes mellitus without complication (HCC)    Hypoglycemia    Pericarditis    Pleural effusion associated with pulmonary infection    RA (rheumatoid arthritis) ([place])     Patient Active Problem List   Diagnosis Date Noted   Intussusception intestine ([place]) 12/12/2020   S/P exploratory laparotomy 12/12/2020   Acute appendicitis 03/03/2019    Past Surgical History:  Procedure Laterality Date   GASTRIC BYPASS     LAPAROSCOPIC APPENDECTOMY N/A 03/03/2019   Procedure: APPENDECTOMY LAPAROSCOPIC;  Surgeon: Stark Klein, MD;  Location: [facility];  Service: General;  Laterality: N/A;   LAPAROTOMY N/A 12/12/2020   Procedure: EXPLORATORY LAPAROTOMY;  Surgeon: Ralene Ok, MD;  Location: [facility];  Service: General;  Laterality: N/A;   TUBAL LIGATION       OB History   No obstetric history on file.     No family history on file.  Social History   Tobacco Use   Smoking status: Never   Smokeless tobacco: Never  Vaping Use   Vaping Use: Never used  Substance Use Topics   Alcohol use: No   Drug use: No    Home Medications Prior to Admission medications   Medication Sig Start Date End Date Taking? Authorizing Provider  acetaminophen (TYLENOL) 325 MG  tablet Take 2-3 tablets (650-975 mg total) by mouth every 6 (six) hours as needed for mild pain, moderate pain, fever or headache (or Fever >/= 101). 03/04/19   Norm Parcel, PA-C  Adalimumab 40 MG/0.4ML PNKT Inject 40 mg into the skin every 14 (fourteen) days. 09/09/20   [provider]  albuterol (VENTOLIN HFA) 108 (90 Base) MCG/ACT inhaler Inhale 1-2 puffs into the lungs every 6 (six) hours as needed for wheezing or shortness of breath. Patient not taking: Reported on 12/12/2020 01/19/19   Tedd Sias, PA  ascorbic acid (VITAMIN C) 1000 MG tablet Take 1,000 mg by mouth daily.  Patient not taking: Reported on 12/12/2020    [provider]  cetirizine (ZYRTEC) 10 MG tablet Take 10 mg by mouth daily.    [provider]  hydroxychloroquine (PLAQUENIL) 200 MG tablet Take 300 mg by mouth daily.  Patient not taking: Reported on 12/12/2020 10/08/18   [provider]  KRILL OIL PO Take 1 capsule by mouth daily.    [provider]  methocarbamol (ROBAXIN) 500 MG tablet Take 1 tablet (500 mg total) by mouth every 6 (six) hours as needed for up to 5 days for muscle spasms. 12/15/20 12/20/20  Winferd Humphrey, PA-C  NON FORMULARY xifaxin  Finished this morning Patient not taking: Reported on 12/12/2020    [provider]  norelgestromin-ethinyl estradiol (ORTHO EVRA)  150-35 MCG/24HR transdermal patch Place 1 patch onto the skin once a week. Patient not taking: Reported on 07/18/2019 01/31/19   [provider]  omeprazole (PRILOSEC) 20 MG capsule Take 20 mg by mouth daily as needed (for reflux).  08/15/18   [provider]  predniSONE (DELTASONE) 5 MG tablet Take 5 mg by mouth See admin instructions. Take 5 mg by mouth as needed for R.A.- every other day for 7 days at a time 10/08/18   [provider]  Prenatal Vit-Fe Fumarate-FA (PRENATAL MULTIVITAMIN) TABS tablet Take 1 tablet by mouth daily at 12 noon. Patient not taking:  Reported on 12/12/2020    [provider]  traMADol (ULTRAM) 50 MG tablet Take 2 tablets (100 mg total) by mouth every 6 (six) hours as needed for up to 5 days for severe pain or moderate pain (can use 50 mg (1 tablet) first for moderate pain. 100 mg (2 tablets) for severe pain. Do NOT take more than 2 tablets every 6 hours). 12/15/20 12/20/20  Eric Form, PA-C  Vitamin D, Ergocalciferol, (DRISDOL) 1.25 MG (50000 UNIT) CAPS capsule Take 50,000 Units by mouth every Monday.  09/10/18   [provider]    Allergies    Amoxicillin-pot clavulanate  Review of Systems   Review of Systems  Constitutional:  Negative for chills and fever.  Respiratory:  Negative for shortness of breath.   Cardiovascular:  Negative for chest pain.  Gastrointestinal:  Negative for abdominal pain, nausea and vomiting.  Skin:  Negative for color change and rash.       + Surgical site in place to midline abdomen  Allergic/Immunologic: Negative for immunocompromised state.  All other systems reviewed and are negative.  Physical Exam Updated Vital Signs BP 100/63 (BP Location: Right Arm)   Pulse 72   Temp 98.5 F (36.9 C) (Oral)   Resp 16   SpO2 100%   Physical Exam Vitals and nursing note reviewed.  Constitutional:      General: She is not in acute distress.    Appearance: She is not diaphoretic.  HENT:     Head: Normocephalic and atraumatic.     Mouth/Throat:     Pharynx: No oropharyngeal exudate.  Eyes:     General: No scleral icterus.    Conjunctiva/sclera: Conjunctivae normal.  Cardiovascular:     Rate and Rhythm: Normal rate and regular rhythm.     Pulses: Normal pulses.     Heart sounds: Normal heart sounds.  Pulmonary:     Effort: Pulmonary effort is normal. No respiratory distress.     Breath sounds: Normal breath sounds. No wheezing.  Abdominal:     General: Bowel sounds are normal.     Palpations: Abdomen is soft. There is no mass.     Tenderness: There is no  abdominal tenderness. There is no guarding or rebound.     Comments: Well-healing surgical site to midline abdomen.  No signs of dehiscence, erythema, purulent drainage.  No tenderness to palpation to abdomen.  See picture below.  Musculoskeletal:        General: Normal range of motion.     Cervical back: Normal range of motion and neck supple.     Comments: Strength and sensation intact to bilateral upper and lower extremities.  Skin:    General: Skin is warm and dry.     Comments: No TTP. No overlying erythema. Sutures in place to abdomen.  Neurological:     Mental Status: She is  alert.  Psychiatric:        Behavior: Behavior normal.           ED Results / Procedures / Treatments   Labs (all labs ordered are listed, but only abnormal results are displayed) Labs Reviewed - No data to display  EKG None  Radiology No results found.  Procedures Procedures   Medications Ordered in ED Medications - No data to display  ED Course  I have reviewed the triage vital signs and the nursing notes.  Pertinent labs & imaging results that were available during my care of the patient were reviewed by me and considered in my medical decision making (see chart for details).    MDM Rules/Calculators/A&P                         Patient presents to the ED with concerns for wound check status post exploratory lap 1 week ago.  Patient denies fever.  Vital signs stable, patient afebrile, no hypoxia.  Surgical bandage removed by myself in the ED. On exam patient with well-healing surgical site with staples in place.  No overlying erythema, no dehiscence of the wound, no purulent drainage.  No tenderness to palpation to abdomen.  No concern for infection at this time, patient afebrile, no dehiscence of wound noted.  Labs not needed at this time.  Area rebandaged in the ED with Steri-Strips and nonadherent dressing.  Discussed with patient importance of maintaining follow-up with surgery,  provided contact information for [facility] surgery for her to contact regarding postop instructions.  At time of discharge patient with vital signs stable, afebrile.  Discussed with patient importance of maintaining follow-up with surgery on her GI specialist.  Patient acknowledges and verbalizes understanding.  Supportive care measures discussed with patient.  Strict return precautions discussed.  Patient for safe discharge at this time.  Follow-up as indicated discharge paperwork.  Final Clinical Impression(s) / ED Diagnoses Final diagnoses:  Encounter for post surgical wound check    Rx / DC Orders ED Discharge Orders     None        Song Garris A, PA-C 12/20/20 [place], DO 12/23/20 2357

## 2020-12-20 NOTE — ED Notes (Signed)
Steri strips placed over existing staples, Tegaderm applies over steri strips.

## 2020-12-20 NOTE — Discharge Instructions (Signed)
Your surgical site appears well without concern for infection or staples coming out at this time.  Follow-up with your scheduled appointments with the surgeon.  Follow-up with your primary care provider as needed.  Return to the emergency department if you are experiencing increasing/worsening pain to the area, redness to the area, drainage, or staples coming out prematurely.

## 2020-12-20 NOTE — ED Triage Notes (Signed)
Patient c/o wound dehiscence to abdomen after exploratory lap x1 week ago.

## 2020-12-21 ENCOUNTER — Emergency Department (HOSPITAL_COMMUNITY): Payer: Medicaid Other

## 2020-12-21 ENCOUNTER — Inpatient Hospital Stay (HOSPITAL_COMMUNITY)
Admission: EM | Admit: 2020-12-21 | Discharge: 2020-12-23 | DRG: 392 | Disposition: A | Discharge status: Home / Self Care | Payer: Medicaid Other | Attending: Surgery | Admitting: Surgery

## 2020-12-21 ENCOUNTER — Encounter (HOSPITAL_COMMUNITY): Payer: Self-pay | Admitting: *Deleted

## 2020-12-21 ENCOUNTER — Other Ambulatory Visit: Payer: Self-pay

## 2020-12-21 DIAGNOSIS — R110 Nausea: Secondary | ICD-10-CM | POA: Diagnosis present

## 2020-12-21 DIAGNOSIS — Z7952 Long term (current) use of systemic steroids: Secondary | ICD-10-CM

## 2020-12-21 DIAGNOSIS — R7401 Elevation of levels of liver transaminase levels: Secondary | ICD-10-CM | POA: Diagnosis present

## 2020-12-21 DIAGNOSIS — R109 Unspecified abdominal pain: Secondary | ICD-10-CM | POA: Diagnosis present

## 2020-12-21 DIAGNOSIS — R7989 Other specified abnormal findings of blood chemistry: Secondary | ICD-10-CM

## 2020-12-21 DIAGNOSIS — R1011 Right upper quadrant pain: Principal | ICD-10-CM | POA: Diagnosis present

## 2020-12-21 DIAGNOSIS — E119 Type 2 diabetes mellitus without complications: Secondary | ICD-10-CM | POA: Diagnosis present

## 2020-12-21 DIAGNOSIS — Z881 Allergy status to other antibiotic agents status: Secondary | ICD-10-CM

## 2020-12-21 DIAGNOSIS — Z9884 Bariatric surgery status: Secondary | ICD-10-CM

## 2020-12-21 DIAGNOSIS — R933 Abnormal findings on diagnostic imaging of other parts of digestive tract: Secondary | ICD-10-CM

## 2020-12-21 DIAGNOSIS — D72829 Elevated white blood cell count, unspecified: Secondary | ICD-10-CM | POA: Diagnosis present

## 2020-12-21 DIAGNOSIS — Z79899 Other long term (current) drug therapy: Secondary | ICD-10-CM

## 2020-12-21 DIAGNOSIS — M069 Rheumatoid arthritis, unspecified: Secondary | ICD-10-CM | POA: Diagnosis present

## 2020-12-21 DIAGNOSIS — Z79620 Long term (current) use of immunosuppressive biologic: Secondary | ICD-10-CM

## 2020-12-21 LAB — CBC WITH DIFFERENTIAL/PLATELET
Abs Immature Granulocytes: 0.11 10*3/uL — ABNORMAL HIGH (ref 0.00–0.07)
Basophils Absolute: 0 10*3/uL (ref 0.0–0.1)
Basophils Relative: 0 %
Eosinophils Absolute: 0 10*3/uL (ref 0.0–0.5)
Eosinophils Relative: 0 %
HCT: 38.5 % (ref 36.0–46.0)
Hemoglobin: 12.9 g/dL (ref 12.0–15.0)
Immature Granulocytes: 1 %
Lymphocytes Relative: 7 %
Lymphs Abs: 0.9 10*3/uL (ref 0.7–4.0)
MCH: 29.2 pg (ref 26.0–34.0)
MCHC: 33.5 g/dL (ref 30.0–36.0)
MCV: 87.1 fL (ref 80.0–100.0)
Monocytes Absolute: 0.1 10*3/uL (ref 0.1–1.0)
Monocytes Relative: 1 %
Neutro Abs: 11.4 10*3/uL — ABNORMAL HIGH (ref 1.7–7.7)
Neutrophils Relative %: 91 %
Platelets: 290 10*3/uL (ref 150–400)
RBC: 4.42 MIL/uL (ref 3.87–5.11)
RDW: 12.6 % (ref 11.5–15.5)
WBC: 12.6 10*3/uL — ABNORMAL HIGH (ref 4.0–10.5)
nRBC: 0 % (ref 0.0–0.2)

## 2020-12-21 LAB — URINALYSIS, ROUTINE W REFLEX MICROSCOPIC
Bilirubin Urine: NEGATIVE
Glucose, UA: NEGATIVE mg/dL
Hgb urine dipstick: NEGATIVE
Ketones, ur: NEGATIVE mg/dL
Nitrite: NEGATIVE
Protein, ur: NEGATIVE mg/dL
Specific Gravity, Urine: 1.039 — ABNORMAL HIGH (ref 1.005–1.030)
pH: 6 (ref 5.0–8.0)

## 2020-12-21 LAB — COMPREHENSIVE METABOLIC PANEL
ALT: 91 U/L — ABNORMAL HIGH (ref 0–44)
AST: 188 U/L — ABNORMAL HIGH (ref 15–41)
Albumin: 4.4 g/dL (ref 3.5–5.0)
Alkaline Phosphatase: 115 U/L (ref 38–126)
Anion gap: 12 (ref 5–15)
BUN: 13 mg/dL (ref 6–20)
CO2: 24 mmol/L (ref 22–32)
Calcium: 9.6 mg/dL (ref 8.9–10.3)
Chloride: 102 mmol/L (ref 98–111)
Creatinine, Ser: 0.79 mg/dL (ref 0.44–1.00)
GFR, Estimated: >60 mL/min (ref >60)
Glucose, Bld: 143 mg/dL — ABNORMAL HIGH (ref 70–99)
Potassium: 3.2 mmol/L — ABNORMAL LOW (ref 3.5–5.1)
Sodium: 138 mmol/L (ref 135–145)
Total Bilirubin: 0.8 mg/dL (ref 0.3–1.2)
Total Protein: 8 g/dL (ref 6.5–8.1)

## 2020-12-21 LAB — LIPASE, BLOOD: Lipase: 29 U/L (ref 11–51)

## 2020-12-21 LAB — I-STAT BETA HCG BLOOD, ED (MC, WL, AP ONLY): I-stat hCG, quantitative: <5 m[IU]/mL (ref <5)

## 2020-12-21 MED ORDER — SIMETHICONE 80 MG PO CHEW
40.0000 mg | CHEWABLE_TABLET | Freq: Four times a day (QID) | ORAL | Status: DC | PRN
  Filled 2020-12-21: qty 1

## 2020-12-21 MED ORDER — HYDROMORPHONE HCL 1 MG/ML IJ SOLN
1.0000 mg | Freq: Once | INTRAMUSCULAR | Status: AC
  Administered 2020-12-21: 04:00:00 1 mg via INTRAVENOUS
  Filled 2020-12-21: qty 1

## 2020-12-21 MED ORDER — ENOXAPARIN SODIUM 40 MG/0.4ML IJ SOSY
40.0000 mg | PREFILLED_SYRINGE | INTRAMUSCULAR | Status: DC
  Administered 2020-12-21 – 2020-12-22 (×2): 40 mg via SUBCUTANEOUS
  Filled 2020-12-21 (×3): qty 0.4

## 2020-12-21 MED ORDER — IOHEXOL 350 MG/ML SOLN
80.0000 mL | Freq: Once | INTRAVENOUS | Status: AC | PRN
  Administered 2020-12-21: 06:00:00 80 mL via INTRAVENOUS

## 2020-12-21 MED ORDER — ONDANSETRON HCL 4 MG/2ML IJ SOLN
4.0000 mg | Freq: Once | INTRAMUSCULAR | Status: AC
  Administered 2020-12-21: 04:00:00 4 mg via INTRAVENOUS
  Filled 2020-12-21: qty 2

## 2020-12-21 MED ORDER — DIPHENHYDRAMINE HCL 50 MG/ML IJ SOLN: 25.0000 mg | Freq: Four times a day (QID) | INTRAMUSCULAR | Status: DC | PRN

## 2020-12-21 MED ORDER — ACETAMINOPHEN 500 MG PO TABS
1000.0000 mg | ORAL_TABLET | Freq: Four times a day (QID) | ORAL | Status: DC
  Administered 2020-12-21 – 2020-12-23 (×4): 1000 mg via ORAL
  Filled 2020-12-21 (×5): qty 2

## 2020-12-21 MED ORDER — PANTOPRAZOLE SODIUM 40 MG PO TBEC
40.0000 mg | DELAYED_RELEASE_TABLET | Freq: Every day | ORAL | Status: DC
  Administered 2020-12-21 – 2020-12-23 (×3): 40 mg via ORAL
  Filled 2020-12-21 (×3): qty 1

## 2020-12-21 MED ORDER — MORPHINE SULFATE (PF) 2 MG/ML IV SOLN
1.0000 mg | INTRAVENOUS | Status: DC | PRN
Start: 2020-12-21 — End: 2020-12-23
  Administered 2020-12-22 (×2): 2 mg via INTRAVENOUS
  Filled 2020-12-21 (×2): qty 1

## 2020-12-21 MED ORDER — METHOCARBAMOL 500 MG PO TABS: 500.0000 mg | ORAL_TABLET | Freq: Four times a day (QID) | ORAL | Status: DC | PRN

## 2020-12-21 MED ORDER — MELATONIN 3 MG PO TABS: 3.0000 mg | ORAL_TABLET | Freq: Every evening | ORAL | Status: DC | PRN

## 2020-12-21 MED ORDER — ALUM & MAG HYDROXIDE-SIMETH 200-200-20 MG/5ML PO SUSP
30.0000 mL | Freq: Once | ORAL | Status: AC
  Administered 2020-12-21: 04:00:00 30 mL via ORAL
  Filled 2020-12-21: qty 30

## 2020-12-21 MED ORDER — TRAMADOL HCL 50 MG PO TABS
50.0000 mg | ORAL_TABLET | Freq: Four times a day (QID) | ORAL | Status: DC | PRN
  Administered 2020-12-23: 50 mg via ORAL
  Filled 2020-12-21: qty 1

## 2020-12-21 MED ORDER — KCL IN DEXTROSE-NACL 20-5-0.45 MEQ/L-%-% IV SOLN
INTRAVENOUS | Status: DC
  Filled 2020-12-21 (×4): qty 1000

## 2020-12-21 MED ORDER — DIPHENHYDRAMINE HCL 25 MG PO CAPS: 25.0000 mg | ORAL_CAPSULE | Freq: Four times a day (QID) | ORAL | Status: DC | PRN

## 2020-12-21 MED ORDER — METOPROLOL TARTRATE 5 MG/5ML IV SOLN: 5.0000 mg | Freq: Four times a day (QID) | INTRAVENOUS | Status: DC | PRN

## 2020-12-21 MED ORDER — ONDANSETRON 4 MG PO TBDP: 4.0000 mg | ORAL_TABLET | Freq: Four times a day (QID) | ORAL | Status: DC | PRN

## 2020-12-21 MED ORDER — ONDANSETRON HCL 4 MG/2ML IJ SOLN: 4.0000 mg | Freq: Four times a day (QID) | INTRAMUSCULAR | Status: DC | PRN

## 2020-12-21 MED ORDER — LIDOCAINE VISCOUS HCL 2 % MT SOLN
15.0000 mL | Freq: Once | OROMUCOSAL | Status: AC
  Administered 2020-12-21: 05:00:00 15 mL via ORAL
  Filled 2020-12-21: qty 15

## 2020-12-21 NOTE — ED Notes (Signed)
US at bedside

## 2020-12-21 NOTE — ED Provider Notes (Signed)
[facility] DEPT Provider Note   CSN: [id number] Arrival date & time: 12/21/20  0347     History Chief Complaint  Patient presents with   Abdominal Pain    Linda Shelton is a 35 y.o. female.  35 yo F w/ medical problems as documented below who presents for severe abdominal pain. Had an ex lap 11/19 for concern of possible intussusception. During surgery, there were no demnostrable abnormalities. Was seen here 11/27 for incisional pain and concern for dehiscence. She states that around 0200 she an acute worsening of her pain in the right upper quadrant wrapping around to her right back.  This is different than what she was feeling before.  Is very severe.  Causing her to be nauseous.  Worse with movement.  Worse with palpation.  No urinary symptoms.  No change in bowel status.   Abdominal Pain     Past Medical History:  Diagnosis Date   Anemia    Diabetes mellitus without complication (HCC)    Hypoglycemia    Pericarditis    Pleural effusion associated with pulmonary infection    RA (rheumatoid arthritis) ([place])     Patient Active Problem List   Diagnosis Date Noted   Intussusception intestine ([place]) 12/12/2020   S/P exploratory laparotomy 12/12/2020   Acute appendicitis 03/03/2019    Past Surgical History:  Procedure Laterality Date   GASTRIC BYPASS     LAPAROSCOPIC APPENDECTOMY N/A 03/03/2019   Procedure: APPENDECTOMY LAPAROSCOPIC;  Surgeon: Stark Klein, MD;  Location: [facility];  Service: General;  Laterality: N/A;   LAPAROTOMY N/A 12/12/2020   Procedure: EXPLORATORY LAPAROTOMY;  Surgeon: Ralene Ok, MD;  Location: [facility];  Service: General;  Laterality: N/A;   TUBAL LIGATION       OB History   No obstetric history on file.     No family history on file.  Social History   Tobacco Use   Smoking status: Never   Smokeless tobacco: Never  Vaping Use   Vaping Use: Never used  Substance Use Topics   Alcohol use: No   Drug  use: No    Home Medications Prior to Admission medications   Medication Sig Start Date End Date Taking? Authorizing Provider  acetaminophen (TYLENOL) 325 MG tablet Take 2-3 tablets (650-975 mg total) by mouth every 6 (six) hours as needed for mild pain, moderate pain, fever or headache (or Fever >/= 101). 03/04/19   Norm Parcel, PA-C  Adalimumab 40 MG/0.4ML PNKT Inject 40 mg into the skin every 14 (fourteen) days. 09/09/20   [provider]  albuterol (VENTOLIN HFA) 108 (90 Base) MCG/ACT inhaler Inhale 1-2 puffs into the lungs every 6 (six) hours as needed for wheezing or shortness of breath. Patient not taking: Reported on 12/12/2020 01/19/19   Tedd Sias, PA  ascorbic acid (VITAMIN C) 1000 MG tablet Take 1,000 mg by mouth daily.  Patient not taking: Reported on 12/12/2020    [provider]  cetirizine (ZYRTEC) 10 MG tablet Take 10 mg by mouth daily.    [provider]  hydroxychloroquine (PLAQUENIL) 200 MG tablet Take 300 mg by mouth daily.  Patient not taking: Reported on 12/12/2020 10/08/18   [provider]  KRILL OIL PO Take 1 capsule by mouth daily.    [provider]  NON FORMULARY xifaxin  Finished this morning Patient not taking: Reported on 12/12/2020    [provider]  norelgestromin-ethinyl estradiol (ORTHO EVRA) 150-35 MCG/24HR transdermal patch Place 1  patch onto the skin once a week. Patient not taking: Reported on 07/18/2019 01/31/19   [provider]  omeprazole (PRILOSEC) 20 MG capsule Take 20 mg by mouth daily as needed (for reflux).  08/15/18   [provider]  predniSONE (DELTASONE) 5 MG tablet Take 5 mg by mouth See admin instructions. Take 5 mg by mouth as needed for R.A.- every other day for 7 days at a time 10/08/18   [provider]  Prenatal Vit-Fe Fumarate-FA (PRENATAL MULTIVITAMIN) TABS tablet Take 1 tablet by mouth daily at 12 noon. Patient not taking: Reported on 12/12/2020     [provider]  Vitamin D, Ergocalciferol, (DRISDOL) 1.25 MG (50000 UNIT) CAPS capsule Take 50,000 Units by mouth every Monday.  09/10/18   [provider]    Allergies    Amoxicillin-pot clavulanate  Review of Systems   Review of Systems  Gastrointestinal:  Positive for abdominal pain.  All other systems reviewed and are negative.  Physical Exam Updated Vital Signs BP 116/90   Pulse 82   Temp (!) 97.5 F (36.4 C) (Oral)   Resp (!) 25   LMP 11/15/2020   SpO2 100%   Physical Exam Vitals and nursing note reviewed.  Constitutional:      Appearance: She is well-developed.  HENT:     Head: Normocephalic and atraumatic.     Nose: No congestion or rhinorrhea.  Eyes:     Pupils: Pupils are equal, round, and reactive to light.  Cardiovascular:     Rate and Rhythm: Normal rate and regular rhythm.  Pulmonary:     Effort: Pulmonary effort is normal. No respiratory distress.     Breath sounds: No stridor.  Abdominal:     General: Abdomen is flat. Bowel sounds are normal. There is no distension.     Palpations: Abdomen is soft.     Tenderness: There is abdominal tenderness (ruq and right lateral along with right back).  Musculoskeletal:        General: No swelling or tenderness. Normal range of motion.     Cervical back: Normal range of motion.  Skin:    General: Skin is warm and dry.  Neurological:     General: No focal deficit present.     Mental Status: She is alert.    ED Results / Procedures / Treatments   Labs (all labs ordered are listed, but only abnormal results are displayed) Labs Reviewed  CBC WITH DIFFERENTIAL/PLATELET - Abnormal; Notable for the following components:      Result Value   WBC 12.6 (*)    Neutro Abs 11.4 (*)    Abs Immature Granulocytes 0.11 (*)    All other components within normal limits  COMPREHENSIVE METABOLIC PANEL  LIPASE, BLOOD  URINALYSIS, ROUTINE W REFLEX MICROSCOPIC  LACTIC ACID, PLASMA  I-STAT BETA HCG BLOOD, ED  ([facility], [facility], [facility] ONLY)    EKG None  Radiology No results found.  Procedures Procedures   Medications Ordered in ED Medications  ondansetron (ZOFRAN) injection 4 mg (4 mg Intravenous Given 12/21/20 0414)  HYDROmorphone (DILAUDID) injection 1 mg (1 mg Intravenous Given 12/21/20 0413)  alum & mag hydroxide-simeth (MAALOX/MYLANTA) 200-200-20 MG/5ML suspension 30 mL (30 mLs Oral Given 12/21/20 0418)    And  lidocaine (XYLOCAINE) 2 % viscous mouth solution 15 mL (15 mLs Oral Given 12/21/20 0433)    ED Course  I have reviewed the triage vital signs and the nursing notes.  Pertinent labs & imaging results that were  available during my care of the patient were reviewed by me and considered in my medical decision making (see chart for details).    MDM Rules/Calculators/A&P                         Patient seems to be in some severe pain.  White count still bit worse than before.  She is tender to palpation in her right upper right flank.  No fever.  No other infectious symptoms.  Plan at this time is to get a CT scan to evaluate for any postop infection. Pain meds provided.   CT with some periportal fluid possibly related to surgery but that was 9 days ago. That is also right where her pain is, so will add on [place] to eval for same.   Care transferred pending [place] results and reeval and ultimate disposition.  Final Clinical Impression(s) / ED Diagnoses Final diagnoses:  None    Rx / DC Orders ED Discharge Orders     None        Cinthia Rodden, Corene Cornea, MD 12/21/20 0800

## 2020-12-21 NOTE — H&P (Signed)
[facility] Admission Note  Linda Shelton 1985-07-03  [id number].    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 Patient was admitted 12/12/20 with CT scan findings of intussusception at the previous jejunojejunostomy. She underwent a negative exploratory laparotomy with no acute complications found intraoperatively. She was discharged home on 12/15/20 in good condition. Patient returned to the ED yesterday with concern for wound dehiscence (which she did not have), and again today with severe abdominal pain. States that the pain is RUQ and radiates into her back with extensive nausea, but no vomiting. It woke her up from sleep around 0200 this morning. Associated symptoms include nausea, but she denies fever or chills.  She states that she drank chicken soup last night because liquids still settle best for her.  She ate some [place] and chicken on Thursday but had pain with this so has not really taken in much in the way of solid food.  She does not smoke or take NSAIDS.  She takes her multi-vitamins daily and has routine EGDs with Dr. Florene Glen at [facility] in [place].  Her last EGD was normal in June of this year with no evidence of ulcer.  In the ED she has a CT scan that shows no complicating features of recent laparotomy and no recurrent intussusception or bowel obstruction. RUQ U/s shows no gallstones and no acute findings. Labwork pertinent for leukocytosis WBC 12.6 and mildly elevated transaminases AST 188, ALT 91. General surgery asked to see.  ROS  All systems reviewed and otherwise negative except for as above.  No family history on file.  Past Medical History:  Diagnosis Date   Anemia    Diabetes mellitus without complication (HCC)    Hypoglycemia     Pericarditis    Pleural effusion associated with pulmonary infection    RA (rheumatoid arthritis) ([place])     Past Surgical History:  Procedure Laterality Date   GASTRIC BYPASS     LAPAROSCOPIC APPENDECTOMY N/A 03/03/2019   Procedure: APPENDECTOMY LAPAROSCOPIC;  Surgeon: Stark Klein, MD;  Location: [facility];  Service: General;  Laterality: N/A;   LAPAROTOMY N/A 12/12/2020   Procedure: EXPLORATORY LAPAROTOMY;  Surgeon: Ralene Ok, MD;  Location: [facility];  Service: General;  Laterality: N/A;   TUBAL LIGATION      Social History:  reports that she has never smoked. She has never used smokeless tobacco. She reports that she does not drink alcohol and does not use drugs.  Allergies:  Allergies  Allergen Reactions   Amoxicillin-Pot Clavulanate Other (See Comments)    Stomach upset/pain    (Not in a hospital admission)   Prior to Admission medications   Medication Sig Start Date End Date Taking? Authorizing Provider  acetaminophen (TYLENOL) 325 MG tablet Take 2-3 tablets (650-975 mg total) by mouth every 6 (six) hours as needed for mild pain, moderate pain, fever or headache (or Fever >/= 101). Patient taking differently: Take 650-975 mg by mouth every 6 (six) hours as needed for mild pain, moderate pain, fever or headache (or Fever >/= 101). 03/04/19  Yes Norm Parcel, PA-C  Adalimumab 40 MG/0.4ML PNKT Inject 40 mg into the skin every 14 (fourteen) days. 09/09/20  Yes [provider]  CALCIUM-VITAMIN D-VITAMIN K PO Take 1 tablet by mouth  daily.   Yes [provider]  cetirizine (ZYRTEC) 10 MG tablet Take 10 mg by mouth daily as needed for allergies.   Yes [provider]  methocarbamol (ROBAXIN) 500 MG tablet Take 500 mg by mouth every 6 (six) hours as needed for muscle spasms.   Yes [provider]  Multiple Vitamin (MULTIVITAMIN) tablet Take 1 tablet by mouth daily.   Yes [provider]  omeprazole (PRILOSEC) 20 MG capsule Take 20 mg  by mouth daily as needed (for reflux).  08/15/18  Yes [provider]  predniSONE (DELTASONE) 5 MG tablet Take 5 mg by mouth See admin instructions. Take 5 mg by mouth as needed for R.A.- every other day for 7 days at a time or 7 days on/7 days off 10/08/18  Yes [provider]  Probiotic Product (PROBIOTIC PO) Take 1 capsule by mouth daily.   Yes [provider]    Blood pressure (!) 142/90, pulse 76, temperature (!) 97.5 F (36.4 C), temperature source Oral, resp. rate 16, height 5\' 3"  (1.6 m), weight 80.7 kg, last menstrual period 11/15/2020, SpO2 97 %. Physical Exam: General: pleasant, WD/WN female who is laying in bed in NAD HEENT: head is normocephalic, atraumatic.  Sclera are noninjected.  Pupils equal and round.  Ears and nose without any masses or lesions.  Mouth is pink and moist. Dentition fair. Heart: regular, rate, and rhythm.  Normal s1,s2. No obvious murmurs, gallops, or rubs noted.  Palpable pedal pulses bilaterally  Lungs: CTAB, no wheezes, rhonchi, or rales noted.  Respiratory effort nonlabored Abd: incision is c/d/I with staples and steri-strips in place.  No evidence of dehiscence, soft, ND, tender greatest in RUQ with some mild voluntary guarding, +BS, no masses, hernias, or organomegaly MS: no BUE/BLE edema, calves soft and nontender Skin: warm and dry with no masses, lesions, or rashes Psych: A&Ox4 with an appropriate affect Neuro: cranial nerves grossly intact, equal strength in BUE/BLE bilaterally, normal speech, thought process intact  Results for orders placed or performed during the hospital encounter of 12/21/20 (from the past 48 hour(s))  Comprehensive metabolic panel     Status: Abnormal   Collection Time: 12/21/20  4:02 AM  Result Value Ref Range   Sodium 138 135 - 145 mmol/L   Potassium 3.2 (L) 3.5 - 5.1 mmol/L   Chloride 102 98 - 111 mmol/L   CO2 24 22 - 32 mmol/L   Glucose, Bld 143 (H) 70 - 99 mg/dL    Comment: Glucose reference  range applies only to samples taken after fasting for at least 8 hours.   BUN 13 6 - 20 mg/dL   Creatinine, Ser 0.79 0.44 - 1.00 mg/dL   Calcium 9.6 8.9 - 10.3 mg/dL   Total Protein 8.0 6.5 - 8.1 g/dL   Albumin 4.4 3.5 - 5.0 g/dL   AST 188 (H) 15 - 41 U/L   ALT 91 (H) 0 - 44 U/L   Alkaline Phosphatase 115 38 - 126 U/L   Total Bilirubin 0.8 0.3 - 1.2 mg/dL   GFR, Estimated >60 >60 mL/min    Comment: (NOTE) Calculated using the CKD-EPI Creatinine Equation (2021)    Anion gap 12 5 - 15    Comment: Performed at [facility], [place] [address], [place], [place] [address]  Lipase, blood     Status: None   Collection Time: 12/21/20  4:02 AM  Result Value Ref Range   Lipase 29 11 - 51 U/L  Comment: Performed at [facility], [place] [address] [place], [place] [address]  CBC with Diff     Status: Abnormal   Collection Time: 12/21/20  4:02 AM  Result Value Ref Range   WBC 12.6 (H) 4.0 - 10.5 K/uL   RBC 4.42 3.87 - 5.11 MIL/uL   Hemoglobin 12.9 12.0 - 15.0 g/dL   HCT 38.5 36.0 - 46.0 %   MCV 87.1 80.0 - 100.0 fL   MCH 29.2 26.0 - 34.0 pg   MCHC 33.5 30.0 - 36.0 g/dL   RDW 12.6 11.5 - 15.5 %   Platelets 290 150 - 400 K/uL   nRBC 0.0 0.0 - 0.2 %   Neutrophils Relative % 91 %   Neutro Abs 11.4 (H) 1.7 - 7.7 K/uL   Lymphocytes Relative 7 %   Lymphs Abs 0.9 0.7 - 4.0 K/uL   Monocytes Relative 1 %   Monocytes Absolute 0.1 0.1 - 1.0 K/uL   Eosinophils Relative 0 %   Eosinophils Absolute 0.0 0.0 - 0.5 K/uL   Basophils Relative 0 %   Basophils Absolute 0.0 0.0 - 0.1 K/uL   Immature Granulocytes 1 %   Abs Immature Granulocytes 0.11 (H) 0.00 - 0.07 K/uL    Comment: Performed at [facility], [place] [address], [place], [place] [address]  I-Stat beta hCG blood, ED     Status: None   Collection Time: 12/21/20  4:18 AM  Result Value Ref Range   I-stat hCG, quantitative <5.0 <5 mIU/mL   Comment 3            Comment:   GEST. AGE      CONC.   (mIU/mL)   <=1 WEEK        5 - 50     2 WEEKS       50 - 500     3 WEEKS       100 - 10,000     4 WEEKS     1,000 - 30,000        FEMALE AND NON-PREGNANT FEMALE:     LESS THAN 5 mIU/mL   Urinalysis, Routine w reflex microscopic Urine, Clean Catch     Status: Abnormal   Collection Time: 12/21/20  9:24 AM  Result Value Ref Range   Color, Urine YELLOW YELLOW   APPearance CLEAR CLEAR   Specific Gravity, Urine 1.039 (H) 1.005 - 1.030   pH 6.0 5.0 - 8.0   Glucose, UA NEGATIVE NEGATIVE mg/dL   Hgb urine dipstick NEGATIVE NEGATIVE   Bilirubin Urine NEGATIVE NEGATIVE   Ketones, ur NEGATIVE NEGATIVE mg/dL   Protein, ur NEGATIVE NEGATIVE mg/dL   Nitrite NEGATIVE NEGATIVE   Leukocytes,Ua TRACE (A) NEGATIVE   RBC / HPF 0-5 0 - 5 RBC/hpf   WBC, UA 11-20 0 - 5 WBC/hpf   Bacteria, UA FEW (A) NONE SEEN   Squamous Epithelial / LPF 0-5 0 - 5    Comment: Performed at [facility], [place] [address] [place], [place] [address]   CT ABDOMEN PELVIS W CONTRAST  Result Date: 12/21/2020 CLINICAL DATA:  Severe abdominal pain. EXAM: CT ABDOMEN AND PELVIS WITH CONTRAST TECHNIQUE: Multidetector CT imaging of the abdomen and pelvis was performed using the standard protocol following bolus administration of intravenous contrast. CONTRAST:  74mL OMNIPAQUE IOHEXOL 350 MG/ML SOLN COMPARISON:  Nine days ago FINDINGS: Lower chest:  No contributory findings. Hepatobiliary: Periportal edema, usually from volume resuscitation.No evidence of biliary obstruction or  stone. Pancreas: Unremarkable. Spleen: Unremarkable. Adrenals/Urinary Tract: Negative adrenals. No hydronephrosis or stone. Unremarkable bladder. Stomach/Bowel: Roux-en-Y gastric bypass. No visible bowel inflammation or recurrent intussusception. History of appendectomy. Vascular/Lymphatic: No acute vascular abnormality. No mass or adenopathy. Reproductive:No pathologic findings. Other: No ascites or pneumoperitoneum. Recent exploratory laparotomy  with expected changes in the anterior abdominal wall. Musculoskeletal: No acute abnormalities. IMPRESSION: No complicating features of recent laparotomy. No recurrent intussusception or bowel obstruction. Electronically Signed   By: Tiburcio Pea M.D.   On: 12/21/2020 06:30   US Abdomen Limited RUQ (LIVER/GB)  Result Date: 12/21/2020 CLINICAL DATA:  Abdominal pain. EXAM: ULTRASOUND ABDOMEN LIMITED RIGHT UPPER QUADRANT COMPARISON:  CT AP, earlier same day. FINDINGS: Gallbladder: No gallstones or wall thickening visualized. No sonographic Murphy sign noted by sonographer. Common bile duct: Diameter: 0.5 cm Liver: No focal lesion identified. Within normal limits in parenchymal echogenicity. Portal vein is patent on color Doppler imaging with normal direction of blood flow towards the liver. Other: No RIGHT upper quadrant ascites. IMPRESSION: Normal RIGHT upper quadrant ultrasound. Electronically Signed   By: Roanna Banning M.D.   On: 12/21/2020 07:33      Assessment/Plan New onset RUQ abdominal pain, POD#9 s/p exploratory laparotomy 12/12/20 by Dr. Derrell Lolling - suspected intussusception seen on CT, intraoperatively she was found to have normal small bowel and RYGB - discharged 12/15/20 and readmitted 12/21/20 with abdominal pain. Found to have Mildly elevated transaminases, otherwise CT scan shows no complicating features of recent laparotomy and no recurrent intussusception or bowel obstruction. RUQ U/s shows no gallstones and no acute findings. Her symptoms are not necessarily c/w ulcer disease and given she had a negative EGD 4 months ago with no other risk factors, I think this is unlikely to be the etiology.  Given her new transaminitis and elevated WBC, will order a HIDA scan to rule out cholecystitis.  If this is negative, could order a hepatitis panel, but her numbers are overall pretty low to think she has hepatitis, but still possible.  -we will admit her for observation for HIDA scan and likely PO  challenge after this.   -given her symptoms and somewhat of improvement and negative imagin, doubt internal hernia or urgent need for surgical intervention.  Hx Roux-en-Y gastric bypass in [place] 9 years ago -multi-vitamins -PPI  ID - none currently VTE - lovenox FEN - NPO for HIDA, IVFs Foley - nond Follow up - TBD  Rheumatoid arthritis  Letha Cape, [facility] 12/21/2020, 10:57 AM Please see Amion for pager number during day hours 7:00am-4:30pm

## 2020-12-21 NOTE — ED Provider Notes (Signed)
Patient seen after prior EDP.  Patient with continued abdominal discomfort.  Case discussed with surgery who will evaluate.   Wynetta Fines, MD 12/21/20 1200

## 2020-12-21 NOTE — ED Notes (Signed)
Pt notified that urine sample is needed.  Pt is pale and reports severe adbominal pain which is radiating toward back.  Pt also had some nausea, medication given and emesis bag.  Pt declines cool washcloth.

## 2020-12-21 NOTE — ED Triage Notes (Signed)
Pt is here for severe abdominal pain which woke her at 2am.  Pt appears to be in severe pain and reports pain in her abdomen and back.  Pt had exploratory abdominal surgery last Saturday.  Pt took omeprazole when she woke up without relief

## 2020-12-22 ENCOUNTER — Inpatient Hospital Stay (HOSPITAL_COMMUNITY): Payer: Medicaid Other

## 2020-12-22 ENCOUNTER — Observation Stay (HOSPITAL_COMMUNITY): Payer: Medicaid Other

## 2020-12-22 ENCOUNTER — Other Ambulatory Visit (HOSPITAL_COMMUNITY): Payer: Self-pay

## 2020-12-22 DIAGNOSIS — Z79899 Other long term (current) drug therapy: Secondary | ICD-10-CM | POA: Diagnosis not present

## 2020-12-22 DIAGNOSIS — E119 Type 2 diabetes mellitus without complications: Secondary | ICD-10-CM | POA: Diagnosis present

## 2020-12-22 DIAGNOSIS — R110 Nausea: Secondary | ICD-10-CM | POA: Diagnosis present

## 2020-12-22 DIAGNOSIS — D72829 Elevated white blood cell count, unspecified: Secondary | ICD-10-CM | POA: Diagnosis present

## 2020-12-22 DIAGNOSIS — M069 Rheumatoid arthritis, unspecified: Secondary | ICD-10-CM | POA: Diagnosis present

## 2020-12-22 DIAGNOSIS — R109 Unspecified abdominal pain: Secondary | ICD-10-CM | POA: Diagnosis not present

## 2020-12-22 DIAGNOSIS — Z7952 Long term (current) use of systemic steroids: Secondary | ICD-10-CM | POA: Diagnosis not present

## 2020-12-22 DIAGNOSIS — Z881 Allergy status to other antibiotic agents status: Secondary | ICD-10-CM | POA: Diagnosis not present

## 2020-12-22 DIAGNOSIS — Z79620 Long term (current) use of immunosuppressive biologic: Secondary | ICD-10-CM | POA: Diagnosis not present

## 2020-12-22 DIAGNOSIS — R1011 Right upper quadrant pain: Secondary | ICD-10-CM | POA: Diagnosis not present

## 2020-12-22 DIAGNOSIS — Z9884 Bariatric surgery status: Secondary | ICD-10-CM | POA: Diagnosis not present

## 2020-12-22 DIAGNOSIS — R7401 Elevation of levels of liver transaminase levels: Secondary | ICD-10-CM | POA: Diagnosis present

## 2020-12-22 LAB — HEPATITIS PANEL, ACUTE
HCV Ab: NONREACTIVE
Hep A IgM: NONREACTIVE
Hep B C IgM: NONREACTIVE
Hepatitis B Surface Ag: NONREACTIVE

## 2020-12-22 LAB — COMPREHENSIVE METABOLIC PANEL
ALT: 433 U/L — ABNORMAL HIGH (ref 0–44)
AST: 317 U/L — ABNORMAL HIGH (ref 15–41)
Albumin: 3.9 g/dL (ref 3.5–5.0)
Alkaline Phosphatase: 206 U/L — ABNORMAL HIGH (ref 38–126)
Anion gap: 7 (ref 5–15)
BUN: 11 mg/dL (ref 6–20)
CO2: 25 mmol/L (ref 22–32)
Calcium: 8.9 mg/dL (ref 8.9–10.3)
Chloride: 105 mmol/L (ref 98–111)
Creatinine, Ser: 0.61 mg/dL (ref 0.44–1.00)
GFR, Estimated: >60 mL/min (ref >60)
Glucose, Bld: 99 mg/dL (ref 70–99)
Potassium: 3.8 mmol/L (ref 3.5–5.1)
Sodium: 137 mmol/L (ref 135–145)
Total Bilirubin: 0.5 mg/dL (ref 0.3–1.2)
Total Protein: 7.3 g/dL (ref 6.5–8.1)

## 2020-12-22 LAB — CBC
HCT: 33 % — ABNORMAL LOW (ref 36.0–46.0)
Hemoglobin: 11.3 g/dL — ABNORMAL LOW (ref 12.0–15.0)
MCH: 29.7 pg (ref 26.0–34.0)
MCHC: 34.2 g/dL (ref 30.0–36.0)
MCV: 86.8 fL (ref 80.0–100.0)
Platelets: 267 10*3/uL (ref 150–400)
RBC: 3.8 MIL/uL — ABNORMAL LOW (ref 3.87–5.11)
RDW: 12.8 % (ref 11.5–15.5)
WBC: 9 10*3/uL (ref 4.0–10.5)
nRBC: 0 % (ref 0.0–0.2)

## 2020-12-22 MED ORDER — TECHNETIUM TC 99M MEBROFENIN IV KIT
5.1000 | PACK | Freq: Once | INTRAVENOUS | Status: AC
  Administered 2020-12-22: 5.1 via INTRAVENOUS

## 2020-12-22 MED ORDER — MORPHINE BOLUS VIA INFUSION: 3.0000 mg | Freq: Once | INTRAVENOUS | Status: DC

## 2020-12-22 MED ORDER — MORPHINE SULFATE (PF) 4 MG/ML IV SOLN: 3.0000 mg | Freq: Once | INTRAVENOUS | Status: AC

## 2020-12-22 MED ORDER — GADOBUTROL 1 MMOL/ML IV SOLN
9.0000 mL | Freq: Once | INTRAVENOUS | Status: AC | PRN
  Administered 2020-12-22: 9 mL via INTRAVENOUS

## 2020-12-22 NOTE — Progress Notes (Signed)
MD Magnus Ivan was paged because patient is asking if she have something to eat or drink. Verbal for clear liquids and NPO at midnight.

## 2020-12-22 NOTE — Progress Notes (Signed)
Subjective: Pain seems somewhat improved today but still tender to touch.  Had some solid foot last night with no reproduction of her symptoms.  No nausea today.  Negative hepatitis history.  Had work up of RUQ abdominal pain last year as well which was negative, negative HIDA and normal EF, MRI with a small liver adenoma which no characteristics for malignancy or further work up.    ROS: See above, otherwise other systems negative  Objective: Vital signs in last 24 hours: Temp:  [97.8 F (36.6 C)-98.9 F (37.2 C)] 98.6 F (37 C) (11/29 0922) Pulse Rate:  [64-77] 68 (11/29 0922) Resp:  [15-20] 17 (11/29 0922) BP: (96-123)/(56-78) 106/66 (11/29 0922) SpO2:  [98 %-100 %] 100 % (11/29 0922) Last BM Date: 12/20/20  Intake/Output from previous day: 11/28 0701 - 11/29 0700 In: 1247 [P.O.:120; I.V.:1127] Out: 400 [Urine:400] Intake/Output this shift: Total I/O In: 119.9 [I.V.:119.9] Out: 400 [Urine:400]  PE: Heart: regular Lungs: CTAB Abd: soft, tender in RUQ, +BS, ND, midline incision c/d/I with staples present.  Lab Results:  Recent Labs    12/21/20 0402 12/22/20 0421  WBC 12.6* 9.0  HGB 12.9 11.3*  HCT 38.5 33.0*  PLT 290 267   BMET Recent Labs    12/21/20 0402 12/22/20 0421  NA 138 137  K 3.2* 3.8  CL 102 105  CO2 24 25  GLUCOSE 143* 99  BUN 13 11  CREATININE 0.79 0.61  CALCIUM 9.6 8.9   PT/INR No results for input(s): LABPROT, INR in the last 72 hours. CMP     Component Value Date/Time   NA 137 12/22/2020 0421   K 3.8 12/22/2020 0421   CL 105 12/22/2020 0421   CO2 25 12/22/2020 0421   GLUCOSE 99 12/22/2020 0421   BUN 11 12/22/2020 0421   CREATININE 0.61 12/22/2020 0421   CALCIUM 8.9 12/22/2020 0421   PROT 7.3 12/22/2020 0421   ALBUMIN 3.9 12/22/2020 0421   AST 317 (H) 12/22/2020 0421   ALT 433 (H) 12/22/2020 0421   ALKPHOS 206 (H) 12/22/2020 0421   BILITOT 0.5 12/22/2020 0421   GFRNONAA >60 12/22/2020 0421   GFRAA >60 07/18/2019 2134    Lipase     Component Value Date/Time   LIPASE 29 12/21/2020 0402       Studies/Results: NM Hepatobiliary Liver Func  Result Date: 12/22/2020 CLINICAL DATA:  Abdominal pain. EXAM: NUCLEAR MEDICINE HEPATOBILIARY IMAGING TECHNIQUE: Sequential images of the abdomen were obtained out to 60 minutes following intravenous administration of radiopharmaceutical. RADIOPHARMACEUTICALS:  5.1 mCi Tc-31m  Choletec IV COMPARISON:  None. FINDINGS: Prompt uptake and biliary excretion of activity by the liver is seen. Biliary activity passes into small bowel, consistent with patent common bile duct. After 1 hour of imaging there was nonvisualization of the gallbladder. The patient subsequently received 3 mg morphine bolus and imaging was performed for an additional 30 minutes. After morphine administration gallbladder activity is visualized, consistent with patency of cystic duct. IMPRESSION: 1. Patent cystic duct without evidence for acute cholecystitis. Electronically Signed   By: Signa Kell M.D.   On: 12/22/2020 10:10   CT ABDOMEN PELVIS W CONTRAST  Result Date: 12/21/2020 CLINICAL DATA:  Severe abdominal pain. EXAM: CT ABDOMEN AND PELVIS WITH CONTRAST TECHNIQUE: Multidetector CT imaging of the abdomen and pelvis was performed using the standard protocol following bolus administration of intravenous contrast. CONTRAST:  55mL OMNIPAQUE IOHEXOL 350 MG/ML SOLN COMPARISON:  Nine days ago FINDINGS: Lower chest:  No  contributory findings. Hepatobiliary: Periportal edema, usually from volume resuscitation.No evidence of biliary obstruction or stone. Pancreas: Unremarkable. Spleen: Unremarkable. Adrenals/Urinary Tract: Negative adrenals. No hydronephrosis or stone. Unremarkable bladder. Stomach/Bowel: Roux-en-Y gastric bypass. No visible bowel inflammation or recurrent intussusception. History of appendectomy. Vascular/Lymphatic: No acute vascular abnormality. No mass or adenopathy. Reproductive:No pathologic  findings. Other: No ascites or pneumoperitoneum. Recent exploratory laparotomy with expected changes in the anterior abdominal wall. Musculoskeletal: No acute abnormalities. IMPRESSION: No complicating features of recent laparotomy. No recurrent intussusception or bowel obstruction. Electronically Signed   By: Tiburcio Pea M.D.   On: 12/21/2020 06:30   US Abdomen Limited RUQ (LIVER/GB)  Result Date: 12/21/2020 CLINICAL DATA:  Abdominal pain. EXAM: ULTRASOUND ABDOMEN LIMITED RIGHT UPPER QUADRANT COMPARISON:  CT AP, earlier same day. FINDINGS: Gallbladder: No gallstones or wall thickening visualized. No sonographic Murphy sign noted by sonographer. Common bile duct: Diameter: 0.5 cm Liver: No focal lesion identified. Within normal limits in parenchymal echogenicity. Portal vein is patent on color Doppler imaging with normal direction of blood flow towards the liver. Other: No RIGHT upper quadrant ascites. IMPRESSION: Normal RIGHT upper quadrant ultrasound. Electronically Signed   By: Roanna Banning M.D.   On: 12/21/2020 07:33    Anti-infectives: Anti-infectives (From admission, onward)    None        Assessment/Plan New onset RUQ abdominal pain, POD#10 s/p exploratory laparotomy 12/12/20 by Dr. Derrell Lolling - suspected intussusception seen on CT, intraoperatively she was found to have normal small bowel and RYGB -no clear etiology as HIDA is negative.  LFTs trending up with AST/ALT in 300-400s and alkphos now 206. -d/w GI and recommended MRCP -hep panel pending -cont to follow LFTs.   Hx Roux-en-Y gastric bypass in [place] 9 years ago -multi-vitamins -PPI   ID - none currently VTE - lovenox FEN - NPO for MRCP, IVFs Foley - none Follow up - TBD   Rheumatoid arthritis - on Humira  LOS: 0 days    Letha Cape , [facility] 12/22/2020, 11:20 AM Please see Amion for pager number during day hours 7:00am-4:30pm or 7:00am -11:30am on weekends

## 2020-12-23 LAB — COMPREHENSIVE METABOLIC PANEL
ALT: 283 U/L — ABNORMAL HIGH (ref 0–44)
AST: 121 U/L — ABNORMAL HIGH (ref 15–41)
Albumin: 4 g/dL (ref 3.5–5.0)
Alkaline Phosphatase: 179 U/L — ABNORMAL HIGH (ref 38–126)
Anion gap: 6 (ref 5–15)
BUN: 8 mg/dL (ref 6–20)
CO2: 23 mmol/L (ref 22–32)
Calcium: 8.8 mg/dL — ABNORMAL LOW (ref 8.9–10.3)
Chloride: 104 mmol/L (ref 98–111)
Creatinine, Ser: 0.57 mg/dL (ref 0.44–1.00)
GFR, Estimated: >60 mL/min (ref >60)
Glucose, Bld: 96 mg/dL (ref 70–99)
Potassium: 3.5 mmol/L (ref 3.5–5.1)
Sodium: 133 mmol/L — ABNORMAL LOW (ref 135–145)
Total Bilirubin: 0.6 mg/dL (ref 0.3–1.2)
Total Protein: 7.2 g/dL (ref 6.5–8.1)

## 2020-12-23 NOTE — Progress Notes (Signed)
Nurse reviewed discharge instructions with pt. Pt verbalized understanding of discharge instructions and follow up appointments.  No new medications ordered for discharge.  Pt has no concerns at time of discharge.

## 2020-12-23 NOTE — Discharge Summary (Signed)
Patient ID: Linda Shelton [id number] February 24, 1985 35 y.o.  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 Patient was admitted 12/12/20 with CT scan findings of intussusception at the previous jejunojejunostomy. She underwent a negative exploratory laparotomy with no acute complications found intraoperatively. She was discharged home on 12/15/20 in good condition. Patient returned to the ED yesterday with concern for wound dehiscence (which she did not have), and again today with severe abdominal pain. States that the pain is RUQ and radiates into her back with extensive nausea, but no vomiting. It woke her up from sleep around 0200 this morning. Associated symptoms include nausea, but she denies fever or chills.  She states that she drank chicken soup last night because liquids still settle best for her.  She ate some [place] and chicken on Thursday but had pain with this so has not really taken in much in the way of solid food.  She does not smoke or take NSAIDS.  She takes her multi-vitamins daily and has routine EGDs with Dr. Lowell Guitar at [facility] in [place].  Her last EGD was normal in June of this year with no evidence of ulcer.   In the ED she has a CT scan that shows no complicating features of recent laparotomy and no recurrent intussusception or bowel obstruction. RUQ U/s shows no gallstones and no acute findings. Labwork  pertinent for leukocytosis WBC 12.6 and mildly elevated transaminases AST 188, ALT 91. General surgery asked to see.  Procedures none  Hospital Course:  The patient was admitted to rule out biliary disease, despite her negative CT scan and [place] in the ED.  There was no evidence of post op complication on either of these imaging modalities.  However, her AST/ALT were slightly bumped at time of admission with a WBC of 12.  She was admitted for a HIDA scan which was normal.  Her AST/ALT and alkphos bumped higher the following day.  A hepatitis panel was negative.  Her pain was improving.  GI was called and recommended an MRCP but did not officially see the patient.  This was also negative.  Her LFTS began to trend down and she was able to tolerate a diet.  She was felt stable for DC home with follow up with bariatric surgery as well as GI MD.   Allergies as of 12/23/2020       Reactions   Amoxicillin-pot Clavulanate Other (See Comments)   Stomach upset/pain        Medication List     TAKE these medications    acetaminophen 325 MG tablet Commonly known as: TYLENOL Take 2-3 tablets (650-975 mg total) by mouth every 6 (six) hours as needed for mild pain, moderate pain, fever or headache (or Fever >/= 101).   Adalimumab 40 MG/0.4ML Pnkt Inject 40 mg into the skin every 14 (fourteen) days.   CALCIUM-VITAMIN D-VITAMIN K PO Take 1 tablet by mouth daily.   cetirizine 10 MG tablet  Commonly known as: ZYRTEC Take 10 mg by mouth daily as needed for allergies.   methocarbamol 500 MG tablet Commonly known as: ROBAXIN Take 500 mg by mouth every 6 (six) hours as needed for muscle spasms.   multivitamin tablet Take 1 tablet by mouth daily.   omeprazole 20 MG capsule Commonly known as: PRILOSEC Take 20 mg by mouth daily as needed (for reflux).   predniSONE 5 MG tablet Commonly known as: DELTASONE Take 5 mg by mouth See admin instructions. Take 5 mg by mouth as needed for R.A.- every other  day for 7 days at a time or 7 days on/7 days off   PROBIOTIC PO Take 1 capsule by mouth daily.          Follow-up Information     gastroenterologist Follow up.   Why: As needed        [name], MD Follow up.   Specialty: General Surgery Why: As needed.  This is a Teacher, English as a foreign language in our office.  If you continue to have symptoms or problems please call to arrange an appointment with he or any of our bariatric surgeons for follow up Contact information: [address] [place] [address] [phone]                 Signed: Barnetta Chapel, [facility] 12/23/2020, 12:07 PM Please see Amion for pager number during day hours 7:00am-4:30pm, 7-11:30am on Weekends

## 2020-12-23 NOTE — Progress Notes (Signed)
Subjective: No abdominal pain now. No n/v.Feels well. MRI shows no evident issues with liver, gallbladder or biliary tree.  ROS: See above, otherwise other systems negative  Objective: Vital signs in last 24 hours: Temp:  [97.4 F (36.3 C)-98.6 F (37 C)] 98 F (36.7 C) (11/30 0535) Pulse Rate:  [68-78] 78 (11/30 0535) Resp:  [15-17] 16 (11/30 0535) BP: (97-127)/(60-77) 97/60 (11/30 0535) SpO2:  [98 %-100 %] 98 % (11/30 0535) Last BM Date: 12/20/20  Intake/Output from previous day: 11/29 0701 - 11/30 0700 In: 1678.9 [P.O.:240; I.V.:1438.9] Out: 1400 [Urine:1400] Intake/Output this shift: No intake/output data recorded.  PE: Heart: regular Lungs: CTAB Abd: soft, nt/nd  Lab Results:  Recent Labs    12/21/20 0402 12/22/20 0421  WBC 12.6* 9.0  HGB 12.9 11.3*  HCT 38.5 33.0*  PLT 290 267   BMET Recent Labs    12/22/20 0421 12/23/20 0432  NA 137 133*  K 3.8 3.5  CL 105 104  CO2 25 23  GLUCOSE 99 96  BUN 11 8  CREATININE 0.61 0.57  CALCIUM 8.9 8.8*   PT/INR No results for input(s): LABPROT, INR in the last 72 hours. CMP     Component Value Date/Time   NA 133 (L) 12/23/2020 0432   K 3.5 12/23/2020 0432   CL 104 12/23/2020 0432   CO2 23 12/23/2020 0432   GLUCOSE 96 12/23/2020 0432   BUN 8 12/23/2020 0432   CREATININE 0.57 12/23/2020 0432   CALCIUM 8.8 (L) 12/23/2020 0432   PROT 7.2 12/23/2020 0432   ALBUMIN 4.0 12/23/2020 0432   AST 121 (H) 12/23/2020 0432   ALT 283 (H) 12/23/2020 0432   ALKPHOS 179 (H) 12/23/2020 0432   BILITOT 0.6 12/23/2020 0432   GFRNONAA >60 12/23/2020 0432   GFRAA >60 07/18/2019 2134   Lipase     Component Value Date/Time   LIPASE 29 12/21/2020 0402       Studies/Results: NM Hepatobiliary Liver Func  Result Date: 12/22/2020 CLINICAL DATA:  Abdominal pain. EXAM: NUCLEAR MEDICINE HEPATOBILIARY IMAGING TECHNIQUE: Sequential images of the abdomen were obtained out to 60 minutes following intravenous  administration of radiopharmaceutical. RADIOPHARMACEUTICALS:  5.1 mCi Tc-46m  Choletec IV COMPARISON:  None. FINDINGS: Prompt uptake and biliary excretion of activity by the liver is seen. Biliary activity passes into small bowel, consistent with patent common bile duct. After 1 hour of imaging there was nonvisualization of the gallbladder. The patient subsequently received 3 mg morphine bolus and imaging was performed for an additional 30 minutes. After morphine administration gallbladder activity is visualized, consistent with patency of cystic duct. IMPRESSION: 1. Patent cystic duct without evidence for acute cholecystitis. Electronically Signed   By: Signa Kell M.D.   On: 12/22/2020 10:10   MR ABDOMEN MRCP W WO CONTAST  Result Date: 12/22/2020 CLINICAL DATA:  Elevated liver function tests. Abdominal pain. 10 days status post exploratory laparotomy. EXAM: MRI ABDOMEN WITHOUT AND WITH CONTRAST (INCLUDING MRCP) TECHNIQUE: Multiplanar multisequence MR imaging of the abdomen was performed both before and after the administration of intravenous contrast. Heavily T2-weighted images of the biliary and pancreatic ducts were obtained, and three-dimensional MRCP images were rendered by post processing. CONTRAST:  81mL GADAVIST GADOBUTROL 1 MMOL/ML IV SOLN COMPARISON:  07/29/2019 FINDINGS: Lower chest: No acute findings. Hepatobiliary: No evidence of hepatic steatosis on chemical shift imaging. A 1.9 cm mass is again seen in the inferior right hepatic lobe which shows subtle T2 hyperintensity, mild homogeneous hyperenhancement, and  lack of contrast washout. This is consistent with a benign etiology, and could represent hepatic adenoma or focal nodular hyperplasia. No other masses are identified. A tiny sub-cm cyst is seen in the left hepatic lobe. Gallbladder is unremarkable. No evidence of biliary ductal dilatation. Pancreas:  No mass or inflammatory changes. Spleen:  Within normal limits in size and appearance.  Adrenals/Urinary Tract: No masses identified. No evidence of hydronephrosis. Stomach/Bowel: Visualized portion unremarkable. Vascular/Lymphatic: No pathologically enlarged lymph nodes identified. No acute vascular findings. Other:  None. Musculoskeletal:  No suspicious bone lesions identified. IMPRESSION: No acute findings. No evidence of hepatic steatosis or biliary ductal dilatation. Stable 1.9 cm mass in the inferior right hepatic lobe is consistent with a benign etiology, and could represent hepatic adenoma or focal nodular hyperplasia. Electronically Signed   By: Danae Orleans M.D.   On: 12/22/2020 18:39    Anti-infectives: Anti-infectives (From admission, onward)    None        Assessment/Plan New onset RUQ abdominal pain, POD#11 s/p exploratory laparotomy 12/12/20 by Dr. Derrell Lolling - suspected intussusception seen on CT, intraoperatively she was found to have normal small bowel and RYGB -no clear etiology as HIDA is negative.  LFTs trending down, MRCP normal -d/w GI and have no further recommendations -hep panel negative -etiology remains elusive; no evident gallstones, no acute pathology on CT, [place] or MRCP. Discussed things to watch out for moving forward; provided she is tolerating diet today, will plan discharge with further long-term plans/care coordination to be established as outpatient. 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

## 2021-03-22 NOTE — Pre-Procedure Instructions (Signed)
Surgical Instructions    Your procedure is scheduled on Friday, March 10th.  Report to [facility] Main Entrance "A" at 5:30 A.M., then check in with the Admitting office.  Call this number if you have problems the morning of surgery:  [phone]   If you have any questions prior to your surgery date call [phone]: Open Monday-Friday 8am-4pm    Remember:  Do not eat after midnight the night before your surgery  You may drink clear liquids until 4:30 a.m. the morning of your surgery.   Clear liquids allowed are: Water, Non-Citrus Juices (without pulp), Carbonated Beverages, Clear Tea, Black Coffee Only (NO MILK, CREAM OR POWDERED CREAMER of any kind), and Gatorade.    Take these medicines the morning of surgery with A SIP OF WATER  As needed: cetirizine (ZYRTEC)  fluticasone (FLONASE)  omeprazole (PRILOSEC)  As of today, STOP taking any Aspirin (unless otherwise instructed by your surgeon) Aleve, Naproxen, Ibuprofen, Motrin, Advil, Goody's, BC's, all herbal medications, fish oil, and all vitamins.                     Do NOT Smoke (Tobacco/Vaping) for 24 hours prior to your procedure.  If you use a CPAP at night, you may bring your mask/headgear for your overnight stay.   Contacts, glasses, piercing's, hearing aid's, dentures or partials may not be worn into surgery, please bring cases for these belongings.    For patients admitted to the hospital, discharge time will be determined by your treatment team.   Patients discharged the day of surgery will not be allowed to drive home, and someone needs to stay with them for 24 hours.  NO VISITORS WILL BE ALLOWED IN PRE-OP WHERE PATIENTS ARE PREPPED FOR SURGERY.  ONLY 1 SUPPORT PERSON MAY BE PRESENT IN THE WAITING ROOM WHILE YOU ARE IN SURGERY.  IF YOU ARE TO BE ADMITTED, ONCE YOU ARE IN YOUR ROOM YOU WILL BE ALLOWED TWO (2) VISITORS. (1) VISITOR MAY STAY OVERNIGHT BUT MUST ARRIVE TO THE ROOM BY 8pm.  Minor children may have two  parents present. Special consideration for safety and communication needs will be reviewed on a case by case basis.   Special instructions:   [facility]- Preparing For Surgery  Before surgery, you can play an important role. Because skin is not sterile, your skin needs to be as free of germs as possible. You can reduce the number of germs on your skin by washing with CHG (chlorahexidine gluconate) Soap before surgery.  CHG is an antiseptic cleaner which kills germs and bonds with the skin to continue killing germs even after washing.    Oral Hygiene is also important to reduce your risk of infection.  Remember - BRUSH YOUR TEETH THE MORNING OF SURGERY WITH YOUR REGULAR TOOTHPASTE  Please do not use if you have an allergy to CHG or antibacterial soaps. If your skin becomes reddened/irritated stop using the CHG.  Do not shave (including legs and underarms) for at least 48 hours prior to first CHG shower. It is OK to shave your face.  Please follow these instructions carefully.   Shower the NIGHT BEFORE SURGERY and the MORNING OF SURGERY  If you chose to wash your hair, wash your hair first as usual with your normal shampoo.  After you shampoo, rinse your hair and body thoroughly to remove the shampoo.  Use CHG Soap as you would any other liquid soap. You can apply CHG directly to the skin and  wash gently with a scrungie or a clean washcloth.   Apply the CHG Soap to your body ONLY FROM THE NECK DOWN.  Do not use on open wounds or open sores. Avoid contact with your eyes, ears, mouth and genitals (private parts). Wash Face and genitals (private parts)  with your normal soap.   Wash thoroughly, paying special attention to the area where your surgery will be performed.  Thoroughly rinse your body with warm water from the neck down.  DO NOT shower/wash with your normal soap after using and rinsing off the CHG Soap.  Pat yourself dry with a CLEAN TOWEL.  Wear CLEAN PAJAMAS to bed the night  before surgery  Place CLEAN SHEETS on your bed the night before your surgery  DO NOT SLEEP WITH PETS.   Day of Surgery: Shower with CHG soap. Do not wear jewelry, make up, nail polish, gel polish, artificial nails, or any other type of covering on natural nails including finger and toenails. If patients have artificial nails, gel coating, etc. that need to be removed by a nail salon please have this removed prior to surgery. Surgery may need to be canceled/delayed if the surgeon/anesthesiologist feels like the patient is unable to be adequately monitored. Do not wear lotions, powders, perfumes, or deodorant. Do not shave 48 hours prior to surgery.  Do not bring valuables to the hospital. [facility] is not responsible for any belongings or valuables. Wear Clean/Comfortable clothing the morning of surgery Remember to brush your teeth WITH YOUR REGULAR TOOTHPASTE.   Please read over the following fact sheets that you were given.   3 days prior to your procedure or After your COVID test   You are not required to quarantine however you are required to wear a well-fitting mask when you are out and around people not in your household. If your mask becomes wet or soiled, replace with a new one.   Wash your hands often with soap and water for 20 seconds or clean your hands with an alcohol-based hand sanitizer that contains at least 60% alcohol.   Do not share personal items.   Notify your provider:  o if you are in close contact with someone who has COVID  o or if you develop a fever of 100.4 or greater, sneezing, cough, sore throat, shortness of breath or body aches.

## 2021-03-23 ENCOUNTER — Inpatient Hospital Stay (HOSPITAL_COMMUNITY)
Admission: RE | Admit: 2021-03-23 | Discharge: 2021-03-23 | Disposition: A | Discharge status: Home / Self Care | Payer: Medicaid Other | Source: Ambulatory Visit

## 2021-04-02 NOTE — Patient Instructions (Addendum)
DUE TO COVID-19 ONLY ONE VISITOR  (aged 34 and older)  IS ALLOWED TO COME WITH YOU AND STAY IN THE WAITING ROOM ONLY DURING PRE OP AND PROCEDURE.   ?**NO VISITORS ARE ALLOWED IN THE SHORT STAY AREA OR RECOVERY ROOM!!** ?     ? Your procedure is scheduled on: 04/28/21 ? ? Report to [facility] Main Entrance ? ?  Report to admitting at 8:15 AM ? ? Call this number if you have problems the morning of surgery [phone] ? ? Do not eat food :After Midnight. ? ? After Midnight you may have the following liquids until 7:30 AM DAY OF SURGERY ? ?Water ?Black Coffee (sugar ok, NO MILK/CREAM OR CREAMERS)  ?Tea (sugar ok, NO MILK/CREAM OR CREAMERS) regular and decaf                             ?Plain Jell-O (NO RED)                                           ?Fruit ices (not with fruit pulp, NO RED)                                     ?Popsicles (NO RED)                                                                  ?Juice: apple, WHITE grape, WHITE cranberry ?Sports drinks like Gatorade (NO RED) ?Clear broth(vegetable,chicken,beef) ?  ?FOLLOW BOWEL PREP AND ANY ADDITIONAL PRE OP INSTRUCTIONS YOU RECEIVED FROM YOUR SURGEON'S OFFICE!!! ?  ?  ?Oral Hygiene is also important to reduce your risk of infection.                                    ?Remember - BRUSH YOUR TEETH THE MORNING OF SURGERY WITH YOUR REGULAR TOOTHPASTE ? ? ? Take these medicines the morning of surgery with A SIP OF WATER: Zyrtec, Omeprazole  ?                  ?           You may not have any metal on your body including hair pins, jewelry, and body piercing ? ?           Do not wear make-up, lotions, powders, perfumes, or deodorant ? ?Do not wear nail polish including gel and S&S, artificial/acrylic nails, or any other type of covering on natural nails including finger and toenails. If you have artificial nails, gel coating, etc. that needs to be removed by a nail salon please have this removed prior to surgery or surgery may need to be canceled/  delayed if the surgeon/ anesthesia feels like they are unable to be safely monitored.  ? ?Do not shave  48 hours prior to surgery.  ? ? Do not bring valuables to the hospital. [facility] IS NOT ?  RESPONSIBLE   FOR VALUABLES. ? ? Contacts, dentures or bridgework may not be worn into surgery. ?  ? Patients discharged on the day of surgery will not be allowed to drive home.  Someone NEEDS to stay with you for the first 24 hours after anesthesia. ? ?            Please read over the following fact sheets you were given: IF YOU HAVE QUESTIONS ABOUT YOUR PRE-OP INSTRUCTIONS PLEASE CALL [phone]- [place] ? ?   [facility] - Preparing for Surgery ?Before surgery, you can play an important role.  Because skin is not sterile, your skin needs to be as free of germs as possible.  You can reduce the number of germs on your skin by washing with CHG (chlorahexidine gluconate) soap before surgery.  CHG is an antiseptic cleaner which kills germs and bonds with the skin to continue killing germs even after washing. ?Please DO NOT use if you have an allergy to CHG or antibacterial soaps.  If your skin becomes reddened/irritated stop using the CHG and inform your nurse when you arrive at Short Stay. ?Do not shave (including legs and underarms) for at least 48 hours prior to the first CHG shower.  You may shave your face/neck. ? ?Please follow these instructions carefully: ? 1.  Shower with CHG Soap the night before surgery and the  morning of surgery. ? 2.  If you choose to wash your hair, wash your hair first as usual with your normal  shampoo. ? 3.  After you shampoo, rinse your hair and body thoroughly to remove the shampoo.                            ? 4.  Use CHG as you would any other liquid soap.  You can apply chg directly to the skin and wash.  Gently with a scrungie or clean washcloth. ? 5.  Apply the CHG Soap to your body ONLY FROM THE NECK DOWN.   Do   not use on face/ open      ?                     Wound or  open sores. Avoid contact with eyes, ears mouth and   genitals (private parts).  ?                     Engineering geologist,  Genitals (private parts) with your normal soap. ?            6.  Wash thoroughly, paying special attention to the area where your    surgery  will be performed. ? 7.  Thoroughly rinse your body with warm water from the neck down. ? 8.  DO NOT shower/wash with your normal soap after using and rinsing off the CHG Soap. ?               9.  Pat yourself dry with a clean towel. ?           10.  Wear clean pajamas. ?           11.  Place clean sheets on your bed the night of your first shower and do not  sleep with pets. ?Day of Surgery : ?Do not apply any lotions/deodorants the morning of surgery.  Please wear clean clothes to the hospital/surgery center. ? ?FAILURE TO FOLLOW THESE INSTRUCTIONS MAY  RESULT IN THE CANCELLATION OF YOUR SURGERY ? ?PATIENT SIGNATURE_________________________________ ? ?NURSE SIGNATURE__________________________________ ? ?________________________________________________________________________  ?

## 2021-04-02 NOTE — Progress Notes (Addendum)
COVID swab appointment: n/a ? ?COVID Vaccine Completed: no ?Date COVID Vaccine completed: ?Has received booster: ?COVID vaccine manufacturer: [employer]  ? ?Date of COVID positive in last 90 days: no ? ?PCP - [facility] ?Cardiologist - n/a ? ?Chest x-ray - n/a ?EKG - 04/05/21 Epic/ chart ?Stress Test - n/a ?ECHO - 04/20/16 [facility] ?Cardiac Cath - n/a ?Pacemaker/ICD device last checked: n/a ?Spinal Cord Stimulator: n/a ? ?Bowel Prep - no ? ?Sleep Study - yes, negative ?CPAP -  ? ?Fasting Blood Sugar - no, hypoglycemia per pt but no longer an issue ?Checks Blood Sugar _____ times a day ? ?Blood Thinner Instructions: n/a ?Aspirin Instructions: ?Last Dose: ? ?Activity level: Can go up a flight of stairs and perform activities of daily living without stopping and without symptoms of chest pain or shortness of breath. ?   ? ?Anesthesia review:  ? ?Patient denies shortness of breath, fever, cough and chest pain at PAT appointment ? ? ?Patient verbalized understanding of instructions that were given to them at the PAT appointment. Patient was also instructed that they will need to review over the PAT instructions again at home before surgery.  ?

## 2021-04-02 NOTE — Progress Notes (Signed)
Please place orders for PAT appointment scheduled 04/05/21. ?

## 2021-04-05 ENCOUNTER — Encounter (HOSPITAL_COMMUNITY)
Admission: RE | Admit: 2021-04-05 | Discharge: 2021-04-05 | Disposition: A | Discharge status: Home / Self Care | Payer: Medicaid Other | Source: Ambulatory Visit | Attending: General Surgery | Admitting: General Surgery

## 2021-04-05 ENCOUNTER — Encounter (HOSPITAL_COMMUNITY): Payer: Self-pay

## 2021-04-05 ENCOUNTER — Other Ambulatory Visit: Payer: Self-pay

## 2021-04-05 VITALS — BP 102/69 | HR 69 | Temp 98.0°F | Resp 14 | Ht 63.0 in | Wt 174.2 lb

## 2021-04-05 DIAGNOSIS — Z01818 Encounter for other preprocedural examination: Secondary | ICD-10-CM | POA: Diagnosis present

## 2021-04-05 DIAGNOSIS — E119 Type 2 diabetes mellitus without complications: Secondary | ICD-10-CM | POA: Insufficient documentation

## 2021-04-05 LAB — CBC
HCT: 36.5 % (ref 36.0–46.0)
Hemoglobin: 12.1 g/dL (ref 12.0–15.0)
MCH: 28.9 pg (ref 26.0–34.0)
MCHC: 33.2 g/dL (ref 30.0–36.0)
MCV: 87.3 fL (ref 80.0–100.0)
Platelets: 288 10*3/uL (ref 150–400)
RBC: 4.18 MIL/uL (ref 3.87–5.11)
RDW: 13.2 % (ref 11.5–15.5)
WBC: 6.2 10*3/uL (ref 4.0–10.5)
nRBC: 0 % (ref 0.0–0.2)

## 2021-04-05 LAB — BASIC METABOLIC PANEL
Anion gap: 8 (ref 5–15)
BUN: 14 mg/dL (ref 6–20)
CO2: 28 mmol/L (ref 22–32)
Calcium: 9.5 mg/dL (ref 8.9–10.3)
Chloride: 103 mmol/L (ref 98–111)
Creatinine, Ser: 0.64 mg/dL (ref 0.44–1.00)
GFR, Estimated: >60 mL/min (ref >60)
Glucose, Bld: 62 mg/dL — ABNORMAL LOW (ref 70–99)
Potassium: 3.6 mmol/L (ref 3.5–5.1)
Sodium: 139 mmol/L (ref 135–145)

## 2021-04-06 LAB — HEMOGLOBIN A1C
Hgb A1c MFr Bld: 5.1 % (ref 4.8–5.6)
Mean Plasma Glucose: 100 mg/dL

## 2021-04-15 ENCOUNTER — Emergency Department (HOSPITAL_COMMUNITY)
Admission: EM | Admit: 2021-04-15 | Discharge: 2021-04-15 | Disposition: A | Discharge status: Home / Self Care | Payer: Medicaid Other | Attending: Emergency Medicine | Admitting: Emergency Medicine

## 2021-04-15 ENCOUNTER — Encounter (HOSPITAL_COMMUNITY): Payer: Self-pay | Admitting: Pharmacy Technician

## 2021-04-15 ENCOUNTER — Other Ambulatory Visit: Payer: Self-pay

## 2021-04-15 ENCOUNTER — Emergency Department (HOSPITAL_COMMUNITY): Payer: Medicaid Other

## 2021-04-15 DIAGNOSIS — N3000 Acute cystitis without hematuria: Secondary | ICD-10-CM | POA: Insufficient documentation

## 2021-04-15 DIAGNOSIS — Z20822 Contact with and (suspected) exposure to covid-19: Secondary | ICD-10-CM | POA: Insufficient documentation

## 2021-04-15 DIAGNOSIS — R1084 Generalized abdominal pain: Secondary | ICD-10-CM

## 2021-04-15 DIAGNOSIS — R309 Painful micturition, unspecified: Secondary | ICD-10-CM

## 2021-04-15 LAB — COMPREHENSIVE METABOLIC PANEL
ALT: 24 U/L (ref 0–44)
AST: 22 U/L (ref 15–41)
Albumin: 3.7 g/dL (ref 3.5–5.0)
Alkaline Phosphatase: 77 U/L (ref 38–126)
Anion gap: 7 (ref 5–15)
BUN: 8 mg/dL (ref 6–20)
CO2: 26 mmol/L (ref 22–32)
Calcium: 9 mg/dL (ref 8.9–10.3)
Chloride: 104 mmol/L (ref 98–111)
Creatinine, Ser: 0.7 mg/dL (ref 0.44–1.00)
GFR, Estimated: >60 mL/min (ref >60)
Glucose, Bld: 90 mg/dL (ref 70–99)
Potassium: 3.7 mmol/L (ref 3.5–5.1)
Sodium: 137 mmol/L (ref 135–145)
Total Bilirubin: 0.5 mg/dL (ref 0.3–1.2)
Total Protein: 6.2 g/dL — ABNORMAL LOW (ref 6.5–8.1)

## 2021-04-15 LAB — I-STAT BETA HCG BLOOD, ED (MC, WL, AP ONLY): I-stat hCG, quantitative: <5 m[IU]/mL (ref <5)

## 2021-04-15 LAB — CBC WITH DIFFERENTIAL/PLATELET
Abs Immature Granulocytes: 0.04 10*3/uL (ref 0.00–0.07)
Basophils Absolute: 0.1 10*3/uL (ref 0.0–0.1)
Basophils Relative: 1 %
Eosinophils Absolute: 0.1 10*3/uL (ref 0.0–0.5)
Eosinophils Relative: 1 %
HCT: 34.6 % — ABNORMAL LOW (ref 36.0–46.0)
Hemoglobin: 11.6 g/dL — ABNORMAL LOW (ref 12.0–15.0)
Immature Granulocytes: 1 %
Lymphocytes Relative: 28 %
Lymphs Abs: 2 10*3/uL (ref 0.7–4.0)
MCH: 29.7 pg (ref 26.0–34.0)
MCHC: 33.5 g/dL (ref 30.0–36.0)
MCV: 88.7 fL (ref 80.0–100.0)
Monocytes Absolute: 0.4 10*3/uL (ref 0.1–1.0)
Monocytes Relative: 6 %
Neutro Abs: 4.6 10*3/uL (ref 1.7–7.7)
Neutrophils Relative %: 63 %
Platelets: 262 10*3/uL (ref 150–400)
RBC: 3.9 MIL/uL (ref 3.87–5.11)
RDW: 13.2 % (ref 11.5–15.5)
WBC: 7.1 10*3/uL (ref 4.0–10.5)
nRBC: 0 % (ref 0.0–0.2)

## 2021-04-15 LAB — URINALYSIS, ROUTINE W REFLEX MICROSCOPIC
Bacteria, UA: NONE SEEN
Bilirubin Urine: NEGATIVE
Glucose, UA: NEGATIVE mg/dL
Hgb urine dipstick: NEGATIVE
Ketones, ur: NEGATIVE mg/dL
Leukocytes,Ua: NEGATIVE
Nitrite: POSITIVE — AB
Protein, ur: NEGATIVE mg/dL
Specific Gravity, Urine: 1.011 (ref 1.005–1.030)
pH: 5 (ref 5.0–8.0)

## 2021-04-15 LAB — RESP PANEL BY RT-PCR (FLU A&B, COVID) ARPGX2
Influenza A by PCR: NEGATIVE
Influenza B by PCR: NEGATIVE
SARS Coronavirus 2 by RT PCR: NEGATIVE

## 2021-04-15 LAB — LACTIC ACID, PLASMA
Lactic Acid, Venous: 0.8 mmol/L (ref 0.5–1.9)
Lactic Acid, Venous: 1 mmol/L (ref 0.5–1.9)

## 2021-04-15 LAB — LIPASE, BLOOD: Lipase: 30 U/L (ref 11–51)

## 2021-04-15 MED ORDER — OXYCODONE-ACETAMINOPHEN 5-325 MG PO TABS: 1.0000 | ORAL_TABLET | ORAL | 0 refills | Status: DC | PRN

## 2021-04-15 MED ORDER — CEPHALEXIN 500 MG PO CAPS: 500.0000 mg | ORAL_CAPSULE | Freq: Two times a day (BID) | ORAL | 0 refills | Status: AC

## 2021-04-15 MED ORDER — MORPHINE SULFATE (PF) 4 MG/ML IV SOLN
4.0000 mg | Freq: Once | INTRAVENOUS | Status: AC
  Administered 2021-04-15: 4 mg via INTRAVENOUS
  Filled 2021-04-15: qty 1

## 2021-04-15 MED ORDER — SODIUM CHLORIDE 0.9 % IV BOLUS
1000.0000 mL | Freq: Once | INTRAVENOUS | Status: AC
  Administered 2021-04-15: 1000 mL via INTRAVENOUS

## 2021-04-15 MED ORDER — IOHEXOL 350 MG/ML SOLN
100.0000 mL | Freq: Once | INTRAVENOUS | Status: AC | PRN
  Administered 2021-04-15: 100 mL via INTRAVENOUS

## 2021-04-15 MED ORDER — ONDANSETRON HCL 4 MG PO TABS: 4.0000 mg | ORAL_TABLET | Freq: Three times a day (TID) | ORAL | 0 refills | Status: DC | PRN

## 2021-04-15 NOTE — ED Provider Notes (Signed)
[facility] EMERGENCY DEPARTMENT Provider Note   CSN: [id number] Arrival date & time: 04/15/21  7829     History  Chief Complaint  Patient presents with   Abdominal Pain    Linda Shelton is a 36 y.o. female.  The history is provided by the patient and medical records. No language interpreter was used.  Abdominal Pain Pain location:  Generalized Pain quality: aching, bloating and cramping   Pain radiates to:  Back, L flank and R flank Pain severity:  Severe Onset quality:  Gradual Duration:  2 days Timing:  Constant Progression:  Worsening Chronicity:  New Relieved by:  Nothing Worsened by:  Palpation Ineffective treatments:  None tried (uti medication of some kind) Associated symptoms: dysuria (pain with urtination) and fatigue   Associated symptoms: no anorexia, no chest pain, no chills, no constipation, no cough, no diarrhea, no fever, no flatus, no melena, no nausea, no shortness of breath, no vaginal bleeding, no vaginal discharge and no vomiting   Risk factors: multiple surgeries       Home Medications Prior to Admission medications   Medication Sig Start Date End Date Taking? Authorizing Provider  acetaminophen (TYLENOL) 325 MG tablet Take 2-3 tablets (650-975 mg total) by mouth every 6 (six) hours as needed for mild pain, moderate pain, fever or headache (or Fever >/= 101). Patient not taking: Reported on 03/18/2021 03/04/19   Juliet Rude, PA-C  Ascorbic Acid (VITAMIN C PO) Take 1 tablet by mouth daily.    [provider]  cetirizine (ZYRTEC) 10 MG tablet Take 10 mg by mouth daily as needed for allergies.    [provider]  cyanocobalamin 1000 MCG tablet Take 1,000 mcg by mouth daily.    [provider]  EVENING PRIMROSE OIL PO Take 1 tablet by mouth daily.    [provider]  fluticasone (FLONASE) 50 MCG/ACT nasal spray Place 1 spray into both nostrils daily as needed for allergies or rhinitis.    [provider]  KRILL OIL PO Take 1 capsule by mouth daily.    [provider]  omeprazole (PRILOSEC) 20 MG capsule Take 20 mg by mouth daily as needed (for reflux).  08/15/18   [provider]  Prenatal Vit-Fe Fumarate-FA (PRENATAL MULTIVITAMIN) TABS tablet Take 1 tablet by mouth daily at 12 noon.    [provider]  Probiotic Product (PROBIOTIC PO) Take 1 capsule by mouth daily.    [provider]      Allergies    Amoxicillin-pot clavulanate    Review of Systems   Review of Systems  Constitutional:  Positive for fatigue. Negative for chills and fever.  HENT:  Negative for congestion.   Eyes:  Negative for visual disturbance.  Respiratory:  Negative for cough, chest tightness, shortness of breath and wheezing.   Cardiovascular:  Negative for chest pain and palpitations.  Gastrointestinal:  Positive for abdominal pain. Negative for anorexia, constipation, diarrhea, flatus, melena, nausea, rectal pain and vomiting.  Genitourinary:  Positive for dysuria (pain with urtination) and flank pain. Negative for frequency, vaginal bleeding and vaginal discharge.  Musculoskeletal:  Positive for back pain. Negative for neck pain and neck stiffness.  Skin:  Negative for rash and wound.  Neurological:  Negative for weakness, light-headedness, numbness and headaches.  Psychiatric/Behavioral:  Negative for agitation and confusion.   All other systems reviewed and are negative.  Physical Exam Updated Vital Signs BP 115/76   Pulse 73   Temp 98.2 F (  36.8 C) (Oral)   Resp 16   LMP 03/30/2021   SpO2 100%  Physical Exam Vitals and nursing note reviewed.  Constitutional:      General: She is not in acute distress.    Appearance: She is well-developed. She is not ill-appearing, toxic-appearing or diaphoretic.  HENT:     Head: Normocephalic and atraumatic.  Eyes:     General: No scleral icterus.    Conjunctiva/sclera: Conjunctivae normal.  Cardiovascular:      Rate and Rhythm: Normal rate and regular rhythm.     Heart sounds: No murmur heard. Pulmonary:     Effort: Pulmonary effort is normal. No respiratory distress.     Breath sounds: Normal breath sounds. No wheezing, rhonchi or rales.  Chest:     Chest wall: No tenderness.  Abdominal:     General: Abdomen is flat. Bowel sounds are normal. There is no distension.     Palpations: Abdomen is soft.     Tenderness: There is generalized abdominal tenderness.  Musculoskeletal:        General: No swelling.     Cervical back: Neck supple.  Skin:    General: Skin is warm and dry.     Capillary Refill: Capillary refill takes less than 2 seconds.  Neurological:     General: No focal deficit present.     Mental Status: She is alert.  Psychiatric:        Mood and Affect: Mood normal.    ED Results / Procedures / Treatments   Labs (all labs ordered are listed, but only abnormal results are displayed) Labs Reviewed  CBC WITH DIFFERENTIAL/PLATELET - Abnormal; Notable for the following components:      Result Value   Hemoglobin 11.6 (*)    HCT 34.6 (*)    All other components within normal limits  COMPREHENSIVE METABOLIC PANEL - Abnormal; Notable for the following components:   Total Protein 6.2 (*)    All other components within normal limits  URINALYSIS, ROUTINE W REFLEX MICROSCOPIC - Abnormal; Notable for the following components:   Color, Urine AMBER (*)    Nitrite POSITIVE (*)    All other components within normal limits  RESP PANEL BY RT-PCR (FLU A&B, COVID) ARPGX2  URINE CULTURE  LIPASE, BLOOD  LACTIC ACID, PLASMA  LACTIC ACID, PLASMA  I-STAT BETA HCG BLOOD, ED ([facility], [facility], [facility] ONLY)    EKG None  Radiology CT ABDOMEN PELVIS W CONTRAST  Result Date: 04/15/2021 CLINICAL DATA:  Lower abdominal pain and back pain with CVA tenderness. EXAM: CT ABDOMEN AND PELVIS WITH CONTRAST TECHNIQUE: Multidetector CT imaging of the abdomen and pelvis was performed using the standard protocol  following bolus administration of intravenous contrast. RADIATION DOSE REDUCTION: This exam was performed according to the departmental dose-optimization program which includes automated exposure control, adjustment of the mA and/or kV according to patient size and/or use of iterative reconstruction technique. CONTRAST:  OMNIPAQUE IOHEXOL 350 MG/ML SOLN COMPARISON:  CT Jun 13, 2020 and MRI December 22, 2020 FINDINGS: Lower chest: No acute abnormality. Hepatobiliary: Subtle 16 mm hypodense lesion in the inferior right lobe of the liver previously characterized on MRI December 22, 2020 as a probable benign hepatic adenoma or focal nodular hyperplasia. No new suspicious hepatic lesions. Gallbladder is unremarkable. No biliary ductal dilation. Pancreas: No pancreatic ductal dilation or evidence of acute inflammation. Spleen: No splenomegaly or focal splenic lesion. Adrenals/Urinary Tract: Bilateral adrenal glands appear normal. No hydronephrosis. Kidneys demonstrate symmetric enhancement. Urinary bladder is  unremarkable for degree of distension. 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 No pathologic dilation of small or large bowel. No evidence of acute bowel inflammation. Small volume of formed stool throughout the colon. The appendix is surgically absent. Vascular/Lymphatic: Normal caliber abdominal aorta. No pathologically enlarged abdominal or pelvic lymph nodes. Reproductive: Uterus and bilateral adnexa are unremarkable. Other: No significant abdominopelvic free fluid. No pneumoperitoneum. Postsurgical change in the abdominal wall. Musculoskeletal: No acute or significant osseous findings. IMPRESSION: 1. No acute abdominopelvic pathology. 2. 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 3. Subtle 16 mm hypodense lesion in the inferior right lobe of the liver  previously characterized on MRI December 22, 2020 as a probable benign hepatic adenoma or focal nodular hyperplasia. Electronically Signed   By: Maudry Mayhew M.D.   On: 04/15/2021 11:38    Procedures Procedures    Medications Ordered in ED Medications  sodium chloride 0.9 % bolus 1,000 mL (0 mLs Intravenous Stopped 04/15/21 1252)  morphine (PF) 4 MG/ML injection 4 mg (4 mg Intravenous Given 04/15/21 0901)  iohexol (OMNIPAQUE) 350 MG/ML injection 100 mL (100 mLs Intravenous Contrast Given 04/15/21 1119)  morphine (PF) 4 MG/ML injection 4 mg (4 mg Intravenous Given 04/15/21 1355)    ED Course/ Medical Decision Making/ A&P                           Medical Decision Making Amount and/or Complexity of Data Reviewed Labs: ordered. Radiology: ordered.  Risk Prescription drug management.    Dashiya Dam is a 36 y.o. female with a past medical history significant for previous intussusception status post exploratory laparotomy, previous gastric bypass surgery, previous appendectomy, previous pericarditis, pleural effusion, and anemia who presents with abdominal pain.  According to patient, since yesterday she has had severe abdominal pain.  She reports it is diffuse and feels like bloating and distention.  She reports the pain goes around towards her back bilaterally.  She also reports it was painful urinate but denies any burning with urination.  She denies any change in her urine otherwise.  Denies constipation or diarrhea and is still passing gas.  She has not had a bowel movement since the pain began yesterday.  She is concerned because it feels like when she has had intussusception and obstruction problems in the past.  She denies history of kidney stones or any abdominal trauma.  She denies nausea or vomiting today.  Denies fevers or chills.  Denies any cough, shortness of breath, chest pain, or congestion.  She reports she is scheduled to have her gallbladder removed in 2 weeks with Dr. [name] and she has been avoiding any greasy or fatty foods to aggravate her gallbladder.  She is unsure if she is having acute cholecystitis or not however the pain is diffuse and not just in the right upper quadrant.  She describes the pain as 9 out of 10 currently.  She denies any vaginal bleeding, vaginal discharge currently pelvic concerns.  No recent abnormal menstrual cycles.  On exam, lungs clear.  Chest nontender.  Abdomen is diffusely tender and bowel sounds were appreciated.  She had bilateral CVA tenderness but no midline back tenderness.  Good pulses in extremities.  Patient resting but clearly uncomfortable.  Vital signs reassuring on my initial eval.  Given patient's history of multiple abdominal surgeries and the report of abdominal distention,  no bowel movement, and severe pain, I am concerned about etiologies such as internal hernia, recurrent intussusception, partial obstruction.  With her known gallbladder problems and upcoming cholecystectomy, cholecystitis is also considered.  Also concerned about possible urinary tract infection with pyelonephritis given the pain with urination and now goes to both flanks and CVA areas.  We will get urinalysis, labs and will get a CT scan to further evaluate and rule out obstruction.  Will give patient pain medicine and fluids and she will remain n.p.o.  She has not eaten since yesterday.  Anticipate reassessment after work-up to determine disposition.  CT scan returned showing no acute abnormality.  Specifically, no evidence of obstruction, internal herniation, or acute cholecystitis given her upcoming cholecystectomy.  Urinalysis did show nitrites which appear to be new.  Given the patient's urinary symptoms and the urinalysis findings, and given her concern that it could be urine after taking the urinary medication, I do suspect it could be UTI causing her symptoms.  I offered to call general surgery given the abdominal pain and previous problems  however she would rather get pain medicine, nausea present, antibiotic prescription, and follow-up with them in clinic.  Given her improvement in symptoms, this is reasonable.  Patient passed p.o. challenge and will be discharged.         Final Clinical Impression(s) / ED Diagnoses Final diagnoses:  Generalized abdominal pain  Painful urination  Acute cystitis without hematuria    Rx / DC Orders ED Discharge Orders          Ordered    cephALEXin (KEFLEX) 500 MG capsule  2 times daily        04/15/21 1505    oxyCODONE-acetaminophen (PERCOCET/ROXICET) 5-325 MG tablet  Every 4 hours PRN        04/15/21 1505    ondansetron (ZOFRAN) 4 MG tablet  Every 8 hours PRN        04/15/21 1505            Clinical Impression: 1. Generalized abdominal pain   2. Painful urination   3. Acute cystitis without hematuria     Disposition: Discharge  Condition: Good  I have discussed the results, Dx and Tx plan with the pt(& family if present). He/she/they expressed understanding and agree(s) with the plan. Discharge instructions discussed at great length. Strict return precautions discussed and pt &/or family have verbalized understanding of the instructions. No further questions at time of discharge.    New Prescriptions   CEPHALEXIN (KEFLEX) 500 MG CAPSULE    Take 1 capsule (500 mg total) by mouth 2 (two) times daily for 7 days.   ONDANSETRON (ZOFRAN) 4 MG TABLET    Take 1 tablet (4 mg total) by mouth every 8 (eight) hours as needed for nausea or vomiting.   OXYCODONE-ACETAMINOPHEN (PERCOCET/ROXICET) 5-325 MG TABLET    Take 1 tablet by mouth every 4 (four) hours as needed for severe pain.    Follow Up: [facility] EMERGENCY DEPARTMENT [address] [id number] mc [place] [address] [phone]    [place] [address] [place] [address] [phone]         Sarra Rachels,  Canary Brim, MD 04/15/21 [phone]

## 2021-04-15 NOTE — Discharge Instructions (Signed)
Your CT imaging today was overall reassuring but your urine did suggest urinary tract infection given the urinary symptoms you presented with with your pain.  Please take the antibiotics as well as the pain and nausea medicine to help maintain hydration and treat your symptoms.  Please follow-up with your general surgery team.  If any symptoms change or worsen acutely, please return to the nearest Emergency Department. ?

## 2021-04-15 NOTE — ED Triage Notes (Signed)
Pt here with reports of lower abdominal pain and lower back pain onset this morning. Pt denies NVD. Pt reports painful urination. Pt took tylenol and uromicina this morning. Pt states she was unable to urinate this morning.  ?

## 2021-04-16 LAB — URINE CULTURE: Culture: NO GROWTH

## 2021-04-28 ENCOUNTER — Ambulatory Visit (HOSPITAL_COMMUNITY): Payer: Medicaid Other | Admitting: Certified Registered Nurse Anesthetist

## 2021-04-28 ENCOUNTER — Ambulatory Visit (HOSPITAL_COMMUNITY): Payer: Medicaid Other

## 2021-04-28 ENCOUNTER — Ambulatory Visit (HOSPITAL_BASED_OUTPATIENT_CLINIC_OR_DEPARTMENT_OTHER): Payer: Medicaid Other | Admitting: Certified Registered Nurse Anesthetist

## 2021-04-28 ENCOUNTER — Ambulatory Visit (HOSPITAL_COMMUNITY)
Admission: RE | Admit: 2021-04-28 | Discharge: 2021-04-28 | Disposition: A | Discharge status: Home / Self Care | Payer: Medicaid Other | Source: Ambulatory Visit | Attending: General Surgery | Admitting: General Surgery

## 2021-04-28 ENCOUNTER — Encounter (HOSPITAL_COMMUNITY): Payer: Self-pay | Admitting: General Surgery

## 2021-04-28 ENCOUNTER — Encounter (HOSPITAL_COMMUNITY)
Admission: RE | Disposition: A | Discharge status: Home / Self Care | Payer: Self-pay | Source: Ambulatory Visit | Attending: General Surgery

## 2021-04-28 DIAGNOSIS — M069 Rheumatoid arthritis, unspecified: Secondary | ICD-10-CM | POA: Diagnosis not present

## 2021-04-28 DIAGNOSIS — K219 Gastro-esophageal reflux disease without esophagitis: Secondary | ICD-10-CM | POA: Insufficient documentation

## 2021-04-28 DIAGNOSIS — K9189 Other postprocedural complications and disorders of digestive system: Secondary | ICD-10-CM | POA: Diagnosis not present

## 2021-04-28 DIAGNOSIS — Z9884 Bariatric surgery status: Secondary | ICD-10-CM | POA: Diagnosis not present

## 2021-04-28 DIAGNOSIS — K660 Peritoneal adhesions (postprocedural) (postinfection): Secondary | ICD-10-CM

## 2021-04-28 DIAGNOSIS — K811 Chronic cholecystitis: Secondary | ICD-10-CM | POA: Diagnosis present

## 2021-04-28 HISTORY — PX: CHOLECYSTECTOMY: SHX55

## 2021-04-28 HISTORY — PX: LAPAROSCOPIC LYSIS OF ADHESIONS: SHX5905

## 2021-04-28 HISTORY — PX: LAPAROSCOPY: SHX197

## 2021-04-28 LAB — PREGNANCY, URINE: Preg Test, Ur: NEGATIVE

## 2021-04-28 SURGERY — LAPAROSCOPIC CHOLECYSTECTOMY WITH INTRAOPERATIVE CHOLANGIOGRAM
Anesthesia: General | Site: Abdomen

## 2021-04-28 MED ORDER — ONDANSETRON HCL 4 MG/2ML IJ SOLN
INTRAMUSCULAR | Status: DC | PRN
  Administered 2021-04-28: 4 mg via INTRAVENOUS

## 2021-04-28 MED ORDER — OXYCODONE HCL 5 MG PO TABS
ORAL_TABLET | ORAL | Status: AC
  Administered 2021-04-28: 5 mg via ORAL
  Filled 2021-04-28: qty 1

## 2021-04-28 MED ORDER — BUPIVACAINE-EPINEPHRINE 0.25% -1:200000 IJ SOLN
INTRAMUSCULAR | Status: DC | PRN
  Administered 2021-04-28: 30 mL

## 2021-04-28 MED ORDER — ORAL CARE MOUTH RINSE: 15.0000 mL | Freq: Once | OROMUCOSAL | Status: AC

## 2021-04-28 MED ORDER — MIDAZOLAM HCL 2 MG/2ML IJ SOLN
INTRAMUSCULAR | Status: AC
  Filled 2021-04-28: qty 2

## 2021-04-28 MED ORDER — OXYCODONE HCL 5 MG/5ML PO SOLN: 5.0000 mg | Freq: Once | ORAL | Status: AC | PRN

## 2021-04-28 MED ORDER — ONDANSETRON HCL 4 MG/2ML IJ SOLN: 4.0000 mg | Freq: Once | INTRAMUSCULAR | Status: DC | PRN

## 2021-04-28 MED ORDER — BUPIVACAINE-EPINEPHRINE (PF) 0.25% -1:200000 IJ SOLN
INTRAMUSCULAR | Status: AC
  Filled 2021-04-28: qty 30

## 2021-04-28 MED ORDER — DEXAMETHASONE SODIUM PHOSPHATE 10 MG/ML IJ SOLN
INTRAMUSCULAR | Status: DC | PRN
  Administered 2021-04-28: 10 mg via INTRAVENOUS

## 2021-04-28 MED ORDER — HYDROMORPHONE HCL 1 MG/ML IJ SOLN
INTRAMUSCULAR | Status: AC
  Administered 2021-04-28: 0.5 mg via INTRAVENOUS
  Filled 2021-04-28: qty 1

## 2021-04-28 MED ORDER — LACTATED RINGERS IR SOLN
Status: DC | PRN
  Administered 2021-04-28: 1000 mL

## 2021-04-28 MED ORDER — ACETAMINOPHEN 500 MG PO TABS
1000.0000 mg | ORAL_TABLET | ORAL | Status: AC
  Administered 2021-04-28: 1000 mg via ORAL
  Filled 2021-04-28: qty 2

## 2021-04-28 MED ORDER — CHLORHEXIDINE GLUCONATE CLOTH 2 % EX PADS: 6.0000 | MEDICATED_PAD | Freq: Once | CUTANEOUS | Status: DC

## 2021-04-28 MED ORDER — 0.9 % SODIUM CHLORIDE (POUR BTL) OPTIME
TOPICAL | Status: DC | PRN
Start: 2021-04-28 — End: 2021-04-28
  Administered 2021-04-28: 1000 mL

## 2021-04-28 MED ORDER — SODIUM CHLORIDE 0.9 % IV SOLN
2.0000 g | INTRAVENOUS | Status: AC
  Administered 2021-04-28: 2 g via INTRAVENOUS
  Filled 2021-04-28: qty 2

## 2021-04-28 MED ORDER — LACTATED RINGERS IV SOLN
INTRAVENOUS | Status: DC
Start: 2021-04-28 — End: 2021-04-28

## 2021-04-28 MED ORDER — KETOROLAC TROMETHAMINE 30 MG/ML IJ SOLN
30.0000 mg | Freq: Once | INTRAMUSCULAR | Status: AC
  Administered 2021-04-28: 30 mg via INTRAVENOUS

## 2021-04-28 MED ORDER — MEPERIDINE HCL 50 MG/ML IJ SOLN
INTRAMUSCULAR | Status: AC
  Administered 2021-04-28: 6.25 mg via INTRAVENOUS
  Filled 2021-04-28: qty 1

## 2021-04-28 MED ORDER — FENTANYL CITRATE PF 50 MCG/ML IJ SOSY
PREFILLED_SYRINGE | INTRAMUSCULAR | Status: AC
  Filled 2021-04-28: qty 2

## 2021-04-28 MED ORDER — SUGAMMADEX SODIUM 200 MG/2ML IV SOLN
INTRAVENOUS | Status: DC | PRN
  Administered 2021-04-28: 200 mg via INTRAVENOUS

## 2021-04-28 MED ORDER — ACETAMINOPHEN 325 MG PO TABS: 1000.0000 mg | ORAL_TABLET | Freq: Three times a day (TID) | ORAL | Status: AC | PRN

## 2021-04-28 MED ORDER — HYDROMORPHONE HCL 1 MG/ML IJ SOLN
0.2500 mg | INTRAMUSCULAR | Status: DC | PRN
  Administered 2021-04-28 (×2): 0.25 mg via INTRAVENOUS
  Administered 2021-04-28: 0.5 mg via INTRAVENOUS

## 2021-04-28 MED ORDER — ROCURONIUM BROMIDE 10 MG/ML (PF) SYRINGE
PREFILLED_SYRINGE | INTRAVENOUS | Status: DC | PRN
  Administered 2021-04-28: 100 mg via INTRAVENOUS

## 2021-04-28 MED ORDER — CHLORHEXIDINE GLUCONATE 0.12 % MT SOLN
15.0000 mL | Freq: Once | OROMUCOSAL | Status: AC
  Administered 2021-04-28: 15 mL via OROMUCOSAL

## 2021-04-28 MED ORDER — AMISULPRIDE (ANTIEMETIC) 5 MG/2ML IV SOLN: 10.0000 mg | Freq: Once | INTRAVENOUS | Status: DC | PRN

## 2021-04-28 MED ORDER — FENTANYL CITRATE (PF) 100 MCG/2ML IJ SOLN
INTRAMUSCULAR | Status: DC | PRN
  Administered 2021-04-28 (×2): 50 ug via INTRAVENOUS
  Administered 2021-04-28: 100 ug via INTRAVENOUS
  Administered 2021-04-28: 50 ug via INTRAVENOUS

## 2021-04-28 MED ORDER — FENTANYL CITRATE (PF) 100 MCG/2ML IJ SOLN
50.0000 ug | INTRAMUSCULAR | Status: DC | PRN
  Administered 2021-04-28: 50 ug via INTRAVENOUS
  Filled 2021-04-28: qty 1

## 2021-04-28 MED ORDER — OXYCODONE HCL 5 MG PO TABS: 5.0000 mg | ORAL_TABLET | Freq: Once | ORAL | Status: AC | PRN

## 2021-04-28 MED ORDER — MIDAZOLAM HCL 5 MG/5ML IJ SOLN
INTRAMUSCULAR | Status: DC | PRN
  Administered 2021-04-28: 2 mg via INTRAVENOUS

## 2021-04-28 MED ORDER — LIDOCAINE 2% (20 MG/ML) 5 ML SYRINGE
INTRAMUSCULAR | Status: DC | PRN
  Administered 2021-04-28: 60 mg via INTRAVENOUS

## 2021-04-28 MED ORDER — OXYCODONE HCL 5 MG PO TABS: 5.0000 mg | ORAL_TABLET | Freq: Four times a day (QID) | ORAL | 0 refills | Status: DC | PRN

## 2021-04-28 MED ORDER — PROPOFOL 10 MG/ML IV BOLUS
INTRAVENOUS | Status: AC
  Filled 2021-04-28: qty 20

## 2021-04-28 MED ORDER — ONDANSETRON HCL 4 MG/2ML IJ SOLN
INTRAMUSCULAR | Status: AC
  Filled 2021-04-28: qty 2

## 2021-04-28 MED ORDER — PROPOFOL 10 MG/ML IV BOLUS
INTRAVENOUS | Status: DC | PRN
  Administered 2021-04-28: 200 mg via INTRAVENOUS

## 2021-04-28 MED ORDER — PHENYLEPHRINE 40 MCG/ML (10ML) SYRINGE FOR IV PUSH (FOR BLOOD PRESSURE SUPPORT)
PREFILLED_SYRINGE | INTRAVENOUS | Status: DC | PRN
Start: 2021-04-28 — End: 2021-04-28
  Administered 2021-04-28: 80 ug via INTRAVENOUS

## 2021-04-28 MED ORDER — KETOROLAC TROMETHAMINE 30 MG/ML IJ SOLN
INTRAMUSCULAR | Status: AC
  Filled 2021-04-28: qty 1

## 2021-04-28 MED ORDER — MEPERIDINE HCL 50 MG/ML IJ SOLN: 6.2500 mg | Freq: Once | INTRAMUSCULAR | Status: AC

## 2021-04-28 MED ORDER — FENTANYL CITRATE (PF) 250 MCG/5ML IJ SOLN
INTRAMUSCULAR | Status: AC
  Filled 2021-04-28: qty 5

## 2021-04-28 SURGICAL SUPPLY — 41 items
APPLIER CLIP 5 13 M/L LIGAMAX5 (MISCELLANEOUS) ×2
BAG COUNTER SPONGE SURGICOUNT (BAG) ×1 IMPLANT
BLADE CLIPPER SURG (BLADE) IMPLANT
CANISTER SUCT 3000ML PPV (MISCELLANEOUS) ×2 IMPLANT
CHLORAPREP W/TINT 26 (MISCELLANEOUS) ×2 IMPLANT
CLIP APPLIE 5 13 M/L LIGAMAX5 (MISCELLANEOUS) ×1 IMPLANT
COVER MAYO STAND STRL (DRAPES) ×2 IMPLANT
COVER SURGICAL LIGHT HANDLE (MISCELLANEOUS) ×2 IMPLANT
DERMABOND ADVANCED (GAUZE/BANDAGES/DRESSINGS) ×1
DERMABOND ADVANCED .7 DNX12 (GAUZE/BANDAGES/DRESSINGS) IMPLANT
DRAPE C-ARM 42X120 X-RAY (DRAPES) ×1 IMPLANT
DRSG TEGADERM 2-3/8X2-3/4 SM (GAUZE/BANDAGES/DRESSINGS) ×6 IMPLANT
DRSG TEGADERM 4X4.75 (GAUZE/BANDAGES/DRESSINGS) ×2 IMPLANT
ELECT REM PT RETURN 15FT ADLT (MISCELLANEOUS) ×2 IMPLANT
GAUZE SPONGE 2X2 8PLY STRL LF (GAUZE/BANDAGES/DRESSINGS) ×1 IMPLANT
GLOVE SURG ENC TEXT LTX SZ7.5 (GLOVE) ×2 IMPLANT
GLOVE SURG UNDER LTX SZ8 (GLOVE) ×4 IMPLANT
GOWN STRL REUS W/ TWL LRG LVL3 (GOWN DISPOSABLE) ×3 IMPLANT
GOWN STRL REUS W/TWL 2XL LVL3 (GOWN DISPOSABLE) ×2 IMPLANT
GOWN STRL REUS W/TWL LRG LVL3 (GOWN DISPOSABLE) ×3
IRRIG SUCT STRYKERFLOW 2 WTIP (MISCELLANEOUS) ×2
IRRIGATION SUCT STRKRFLW 2 WTP (MISCELLANEOUS) ×1 IMPLANT
KIT BASIN OR (CUSTOM PROCEDURE TRAY) ×2 IMPLANT
KIT TURNOVER KIT A (KITS) ×2 IMPLANT
NS IRRIG 1000ML POUR BTL (IV SOLUTION) ×2 IMPLANT
PAD ARMBOARD 7.5X6 YLW CONV (MISCELLANEOUS) ×2 IMPLANT
POUCH RETRIEVAL ECOSAC 10 (ENDOMECHANICALS) ×1 IMPLANT
POUCH RETRIEVAL ECOSAC 10MM (ENDOMECHANICALS) ×1
SCISSORS LAP 5X35 DISP (ENDOMECHANICALS) ×2 IMPLANT
SET CHOLANGIOGRAPH MIX (MISCELLANEOUS) ×2 IMPLANT
SET TUBE SMOKE EVAC HIGH FLOW (TUBING) ×2 IMPLANT
SLEEVE ADV FIXATION 5X100MM (TROCAR) ×1 IMPLANT
SLEEVE ENDOPATH XCEL 5M (ENDOMECHANICALS) ×4 IMPLANT
SPONGE GAUZE 2X2 STER 10/PKG (GAUZE/BANDAGES/DRESSINGS) ×1
STRIP CLOSURE SKIN 1/2X4 (GAUZE/BANDAGES/DRESSINGS) ×2 IMPLANT
SUT MNCRL AB 4-0 PS2 18 (SUTURE) ×2 IMPLANT
SUT VIC AB 0 UR5 27 (SUTURE) ×1 IMPLANT
SUT VICRYL 0 UR6 27IN ABS (SUTURE) IMPLANT
TRAY LAPAROSCOPIC (CUSTOM PROCEDURE TRAY) ×2 IMPLANT
TROCAR ADV FIXATION 5X100MM (TROCAR) ×1 IMPLANT
TROCAR BALLN 12MMX100 BLUNT (TROCAR) ×1 IMPLANT

## 2021-04-28 NOTE — Discharge Instructions (Signed)
[facility] LAPAROSCOPIC SURGERY: POST OP INSTRUCTIONS Always review your discharge instruction sheet given to you by the facility where your surgery was performed. IF YOU HAVE DISABILITY OR FAMILY LEAVE FORMS, YOU MUST BRING THEM TO THE OFFICE FOR PROCESSING.   DO NOT GIVE THEM TO YOUR DOCTOR.  PAIN CONTROL  First take acetaminophen (Tylenol)  to control your pain after surgery.  Follow directions on package.  Taking acetaminophen (Tylenol)  regularly after surgery will help to control your pain and lower the amount of prescription pain medication you may need.  You should not take more than 3,000 mg (3 grams) of acetaminophen (Tylenol) in 24 hours.  You should not take ibuprofen (Advil), aleve, motrin, naprosyn or other NSAIDS if you have a history of stomach ulcers or chronic kidney disease.  A prescription for pain medication may be given to you upon discharge.  Take your pain medication as prescribed, if you still have uncontrolled pain after taking acetaminophen (Tylenol) or ibuprofen (Advil). Use ice packs to help control pain. If you need a refill on your pain medication, please contact your pharmacy.  They will contact our office to request authorization. Prescriptions will not be filled after 5pm or on week-ends.  HOME MEDICATIONS Take your usually prescribed medications unless otherwise directed.  DIET You should follow a light diet the first few days after arrival home.  Be sure to include lots of fluids daily. Avoid fatty, fried foods.   CONSTIPATION It is common to experience some constipation after surgery and if you are taking pain medication.  Increasing fluid intake and taking a stool softener (such as Colace) will usually help or prevent this problem from occurring.  A mild laxative (Milk of Magnesia or Miralax) should be taken according to package instructions if there are no bowel movements after 48 hours.  WOUND/INCISION CARE Most patients will  experience some swelling and bruising in the area of the incisions.  Ice packs will help.  Swelling and bruising can take several days to resolve.  Unless discharge instructions indicate otherwise, follow guidelines below  STERI-STRIPS - you may remove your outer bandages 48 hours after surgery, and you may shower at that time.  You have steri-strips (small skin tapes) in place directly over the incision.  These strips should be left on the skin for 7-10 days.   DERMABOND/SKIN GLUE - you may shower in 24 hours.  The glue will flake off over the next 2-3 weeks. Any sutures or staples will be removed at the office during your follow-up visit.  ACTIVITIES You may resume regular (light) daily activities beginning the next day--such as daily self-care, walking, climbing stairs--gradually increasing activities as tolerated.  You may have sexual intercourse when it is comfortable.  Refrain from any heavy lifting or straining until approved by your doctor. You may drive when you are no longer taking prescription pain medication, you can comfortably wear a seatbelt, and you can safely maneuver your car and apply brakes.  FOLLOW-UP You should see your doctor in the office for a follow-up appointment approximately 2-3 weeks after your surgery.  You should have been given your post-op/follow-up appointment when your surgery was scheduled.  If you did not receive a post-op/follow-up appointment, make sure that you call for this appointment within a day or two after you arrive home to insure a convenient appointment time.  OTHER INSTRUCTIONS   WHEN TO CALL YOUR DOCTOR: Fever over 101.0 Inability to urinate Continued bleeding from incision. Increased   pain, redness, or drainage from the incision. Increasing abdominal pain  The clinic staff is available to answer your questions during regular business hours.  Please don't hesitate to call and ask to speak to one of the nurses for clinical concerns.  If you  have a medical emergency, go to the nearest emergency room or call 911.  A surgeon from [facility] is always on call at the hospital. [address], [address], [place], [place]  [address] ? [address] [address], [place], [place]   [address] [phone] ? [phone] ? FAX [phone] Web site: [email or web address]  

## 2021-04-28 NOTE — Anesthesia Procedure Notes (Signed)
Procedure Name: Intubation ?Date/Time: 04/28/2021 8:39 AM ?Performed by: Gean Maidens, CRNA ?Pre-anesthesia Checklist: Patient identified, Emergency Drugs available, Suction available, Patient being monitored and Timeout performed ?Patient Re-evaluated:Patient Re-evaluated prior to induction ?Oxygen Delivery Method: Circle system utilized ?Preoxygenation: Pre-oxygenation with 100% oxygen ?Induction Type: IV induction ?Ventilation: Mask ventilation without difficulty ?Laryngoscope Size: Mac and 3 ?Grade View: Grade I ?Tube type: Oral ?Tube size: 7.0 mm ?Number of attempts: 1 ?Airway Equipment and Method: Stylet ?Placement Confirmation: ETT inserted through vocal cords under direct vision, positive ETCO2 and breath sounds checked- equal and bilateral ?Secured at: 21 cm ?Tube secured with: Tape ?Dental Injury: Teeth and Oropharynx as per pre-operative assessment  ? ? ? ? ?

## 2021-04-28 NOTE — Op Note (Signed)
Linda Shelton ?[id number] ?May 25, 1985 ?04/28/2021 ? ?Laparoscopic Cholecystectomy with ATTEMPTED IOC; Diagnostic laparoscopy & laparoscopic lysis of adhesions <30 min Procedure Note ? ?Indications: Patient has a remote history of laparoscopic Roux-en-Y gastric bypass at outside facility who has been having chronic colicky right-sided abdominal pain with nausea.  She had previously presented with CT evidence of small bowel intussusception at her jejunojejunostomy and underwent exploratory laparotomy and found no evidence of intussusception at the time.  There is no evidence of bowel wall edema or internal hernia transaminases.  Work-up was performed looking at her gallbladder.  There is no evidence of cholelithiasis.  She had a nuclear medicine scan that revealed no evidence of cholecystitis however no ejection fraction was performed.  I saw her in the clinic and we discussed laparoscopic cholecystectomy because of right-sided abdominal pain with history of transiently elevated LFTs.  Since her visit in the clinic she has presented back to the emergency room again with right-sided abdominal pain.  Because of this I recommended performing a diagnostic laparoscopy this morning in addition to the cholecystectomy.  She agreed and wished to proceed with the procedure ? ?Pre-operative Diagnosis: Right-sided abdominal pain, history of laparoscopic Roux-en-Y gastric bypass ? ?Post-operative Diagnosis: Right-sided abdominal pain, history of laparoscopic antecolic antigastric Roux-en-Y gastric bypass, intra-abdominal adhesion ? ?Surgeon: [name] MD FACS ? ?Assistants: Phylliss Blakes MD FACS (an assistant was requested due to the complexity of her surgical anatomy and potential need in repairing an internal hernia and/or other unplanned procedures) ? ?Anesthesia: General endotracheal anesthesia ? ?Procedure Details  ?The patient was seen again in the Holding Room. The risks, benefits, complications, treatment options, and  expected outcomes were discussed with the patient. The possibilities of reaction to medication, pulmonary aspiration, perforation of viscus, bleeding, recurrent infection, finding a normal gallbladder, the need for additional procedures, failure to diagnose a condition, the possible need to convert to an open procedure, and creating a complication requiring transfusion or operation were discussed with the patient. The likelihood of improving the patient's symptoms with return to their baseline status is good.  The patient and/or family concurred with the proposed plan, giving informed consent. The site of surgery properly noted. The patient was taken to Operating Room, identified as [facility] and the procedure verified as Laparoscopic Cholecystectomy with Intraoperative Cholangiogram and diagnostic laparoscopy with possible pexy of her Roux limb. A Time Out was held and the above information confirmed. Antibiotic prophylaxis was administered.  ? ?Prior to the induction of general anesthesia, antibiotic prophylaxis was administered. General endotracheal anesthesia was then administered and tolerated well. After the induction, the abdomen was prepped with Chloraprep and draped in the sterile fashion. The patient was positioned in the supine position. ? ?Local anesthetic agent was injected into the skin near the umbilicus and an incision made through her old midline incision we dissected down to the abdominal fascia with blunt dissection.  The fascia was incised vertically and we entered the peritoneal cavity bluntly.  A pursestring suture of 0-Vicryl was placed around the fascial opening.  The Hasson cannula was inserted and secured with the stay suture.  Pneumoperitoneum was then created with CO2 and tolerated well without any adverse changes in the patient's vital signs. An 5-mm port was placed in the subxiphoid position.  Two 5-mm ports were placed in the right upper quadrant. All skin incisions were  infiltrated with a local anesthetic agent before making the incision and placing the trocars.  ? ?We positioned the patient in reverse Trendelenburg, tilted  slightly to the patient's left.  The gallbladder was identified, the fundus grasped and retracted cephalad. Adhesions were lysed bluntly and with the electrocautery where indicated, taking care not to injure any adjacent organs or viscus. The infundibulum was grasped and retracted laterally, exposing the peritoneum overlying the triangle of Calot. This was then divided and exposed in a blunt fashion. A critical view of the cystic duct and cystic artery was obtained.  The cystic duct was clearly identified and bluntly dissected circumferentially. The cystic duct was ligated with a clip distally.   An incision was made in the cystic duct and the [facility] cholangiogram catheter introduced. The catheter was secured using a clip.  However the catheter would not flush.  Initially I thought I had the clip too tight on the catheter.  The clip was removed.  I could not advance the cholangiogram catheter anymore.  It felt like I was perhaps hitting a valve.  I continue to manipulate it for a few more minutes but could not get any saline to flush forward.  There was some backflow of bile from the cystic duct however despite numerous attempts I could not get saline to flush forward.  Therefore I decided to not attempt any further manipulation of the cystic duct.   The catheter was then removed.  ? ?The cystic duct was then ligated with clips and divided. The cystic artery which had been identified & dissected free was ligated with clips and divided as well.  ? ?The gallbladder was dissected from the liver bed in retrograde fashion with the electrocautery.  There was some spillage of bile from the gallbladder as mobilizing up out of the gallbladder fossa.  The gallbladder was removed and placed in an Ecco sac.  The gallbladder and Ecco sac were then removed through the umbilical  port site. The liver bed was irrigated and inspected. Hemostasis was achieved with the electrocautery. Copious irrigation was utilized and was repeatedly aspirated until clear.   ? ?We then turned our attention to diagnostic laparoscopy.  We had noticed that there was a piece of omentum going from the anterior abdominal wall all the way down to the small bowel mesentery but we were able to initially maneuver the laparoscope around that for the cholecystectomy portion of the procedure.  The patient was placed in supine position.  It was evident that this piece of omentum was going all the way down to the base of the small bowel mesentery with a loop of small bowel going around it.  Intraoperative photos were taken.  We lysed this band with hook electrocautery.  I then started to run the bowel.  An additional 5 mm trocar was placed on the patient's left side by the assistant.  She lifted up the left lobe of the liver.  Identified the Roux limb.  It was antecolic and antigastric.  Patient did not appear to have a long candycane limb.  The Roux limb was followed down.  There was no Petersons defect.  The Roux limb was then continued to be followed all the way down to the jejunojejunostomy.  It had a little bit of chronic dilatation to it but nothing severe.  There is no surrounding bowel wall thickening.  No venous congestion.  No mesenteric edema.  There is no defect at the jejunojejunostomy mesentery.  The BP limb was identified and ran back proximally.  Again this appeared normal as well.  There is no sign of BP limb dilatation.  I then returned  to the jejunojejunostomy and we ran the bowel distally all the way down to the terminal ileum and cecum.  There was no evidence of any type of mesenteric defect.  There is no evidence of bowel wall thickening or venous congestion or mesenteric edema.  The patient has a very long almost floppy small bowel mesentery.  Dr. Fredricka Bonineonnor and I discussed pexing the Roux limb to the BP  limb.  And/or the common channel.  Because we had found an adhesive band that was causing a segment of small bowel to twist around it we felt that this is the source of her potential right-sided pain since this was

## 2021-04-28 NOTE — Anesthesia Preprocedure Evaluation (Addendum)
Anesthesia Evaluation  ?Patient identified by MRN, date of birth, ID band ?Patient awake ? ? ? ?Reviewed: ?Allergy & Precautions, NPO status , Patient's Chart, lab work & pertinent test results ? ?Airway ?Mallampati: I ? ?TM Distance: >3 FB ?Neck ROM: Full ? ? ? Dental ?no notable dental hx. ?(+) Teeth Intact, Dental Advisory Given ?  ?Pulmonary ?neg pulmonary ROS,  ?  ?Pulmonary exam normal ?breath sounds clear to auscultation ? ? ? ? ? ? Cardiovascular ?negative cardio ROS ?Normal cardiovascular exam ?Rhythm:Regular Rate:Normal ? ? ?  ?Neuro/Psych ?negative neurological ROS ? negative psych ROS  ? GI/Hepatic ?Neg liver ROS, GERD  Medicated and Controlled,S/p gastric bypass ?  ?Endo/Other  ?negative endocrine ROS ? Renal/GU ?negative Renal ROS  ?negative genitourinary ?  ?Musculoskeletal ? ?(+) Arthritis , Rheumatoid disorders,   ? Abdominal ?  ?Peds ?negative pediatric ROS ?(+)  Hematology ?negative hematology ROS ?(+) hct 34.6   ?Anesthesia Other Findings ? ? Reproductive/Obstetrics ?negative OB ROS ?Urine preg neg today ? ?  ? ? ? ? ? ? ? ? ? ? ? ? ? ?  ?  ? ? ? ? ? ? ? ?Anesthesia Physical ?Anesthesia Plan ? ?ASA: 2 ? ?Anesthesia Plan: General  ? ?Post-op Pain Management: Tylenol PO (pre-op)* and Ketamine IV*  ? ?Induction: Intravenous ? ?PONV Risk Score and Plan: 4 or greater and Ondansetron, Dexamethasone, Midazolam and Treatment may vary due to age or medical condition ? ?Airway Management Planned: Oral ETT ? ?Additional Equipment: None ? ?Intra-op Plan:  ? ?Post-operative Plan: Extubation in OR ? ?Informed Consent: I have reviewed the patients History and Physical, chart, labs and discussed the procedure including the risks, benefits and alternatives for the proposed anesthesia with the patient or authorized representative who has indicated his/her understanding and acceptance.  ? ? ? ?Dental advisory given ? ?Plan Discussed with: CRNA ? ?Anesthesia Plan Comments:    ? ? ? ? ? ? ?Anesthesia Quick Evaluation ? ?

## 2021-04-28 NOTE — Anesthesia Postprocedure Evaluation (Signed)
Anesthesia Post Note ? ?Patient: Linda Shelton ? ?Procedure(s) Performed: LAPAROSCOPIC CHOLECYSTECTOMY (Abdomen) ?LAPAROSCOPY DIAGNOSTIC (Abdomen) ?LAPAROSCOPIC LYSIS OF ADHESIONS (Abdomen) ? ?  ? ?Patient location during evaluation: PACU ?Anesthesia Type: General ?Level of consciousness: awake and alert, oriented and patient cooperative ?Pain management: pain level controlled ?Vital Signs Assessment: post-procedure vital signs reviewed and stable ?Respiratory status: spontaneous breathing, nonlabored ventilation and respiratory function stable ?Cardiovascular status: blood pressure returned to baseline and stable ?Postop Assessment: no apparent nausea or vomiting ?Anesthetic complications: no ?Comments: Pain difficult to control initially ? ? ?No notable events documented. ? ?Last Vitals:  ?Vitals:  ? 04/28/21 1130 04/28/21 1157  ?BP: 125/88 120/79  ?Pulse: 80 68  ?Resp: 14 10  ?Temp:    ?SpO2: 100% 99%  ?  ?Last Pain:  ?Vitals:  ? 04/28/21 1157  ?TempSrc:   ?PainSc: Asleep  ? ? ?  ?  ?  ?  ?  ?  ? ?[name] ? ? ? ? ?

## 2021-04-28 NOTE — H&P (Signed)
?CC: here for surgery ? ?Requesting provider: n/a ? ?HPI: ?Linda Shelton is an 36 y.o. female who is here for surgery.  She had another episode of severe abdominal pain around March 23.  She describes it as right-sided abdominal pain that went across her upper abdomen and went to her right back.  She presented to the emergency room for evaluation because she was concerned about a potential recurrent intussusception.  She had labs and imaging.  Her imaging was unremarkable.  Labs apparently showed some protein in her urine and she was given some antibiotics and discharged.  She states she continues to have some mild crampy right-sided pain.  It comes in waves at times.  No fever or chills.  No vomiting. ? ?Linda Shelton is a 36 y.o. female who is seen today for ongoing right-sided abdominal pain. She is accompanied by her husband. She states in November she developed right-sided abdominal pain. She points to her right mid abdomen. She states it was a pinching sensation and it was there for about a week and it gradually started increasing in severity. She presented to the med center and a CT scan demonstrated a small bowel intussusception at her jejunojejunostomy. She has a prior history of laparoscopic Roux-en-Y gastric bypass in [place] 9 years ago. She was taken to the operating room and underwent exploratory laparotomy by Dr. Derrell Lollingamirez. Her jejunojejunostomy was normal. Not thickened or inflamed. No evidence of intussusception at the time of laparotomy. He ran her bowel in its entirety and found no abnormalities. She recovered and was discharged and she came back to the ER on November 29 complaining of severe abdominal pain. She was found to have elevated LFTs. She complained of severe upper central abdominal pain. She had a negative CT scan. She had an ultrasound and a HIDA scan. She comes back in because of ongoing right-sided discomfort. She describes it as a pinch sensation. It is always there. It worsens before  and after meals. It radiates to her back into her right shoulder. She reports normal bowel movements for her. At times she may have some loose stools. She denies any dumping episodes. She states that the discomfort is different than when she has had a dumping episode in the past. She also has a history of SIBO and she states this discomfort while it feels similar is different. She states that she was treated for SIBO. She has had an upper and lower endoscopy within the past year. She states that they were normal. Her hepatitis panel was negative. The gastroenterology team at [facility] is following her LFTs. They are trending downward. No fever or chills. No longer on prednisone for RA or Enbrel. No dysuria or hematuria.  ? ?Past Medical History:  ?Diagnosis Date  ? Anemia   ? Hypoglycemia   ? Pericarditis   ? Pleural effusion associated with pulmonary infection   ? RA (rheumatoid arthritis) (HCC)   ? ? ?Past Surgical History:  ?Procedure Laterality Date  ? GASTRIC BYPASS    ? LAPAROSCOPIC APPENDECTOMY N/A 03/03/2019  ? Procedure: APPENDECTOMY LAPAROSCOPIC;  Surgeon: Almond LintByerly, Faera, MD;  Location: [facility] OR;  Service: General;  Laterality: N/A;  ? LAPAROTOMY N/A 12/12/2020  ? Procedure: EXPLORATORY LAPAROTOMY;  Surgeon: Axel Filleramirez, Armando, MD;  Location: [facility] ORS;  Service: General;  Laterality: N/A;  ? TUBAL LIGATION    ? ? ?History reviewed. No pertinent family history. ? ?Social:  reports that she has never smoked. She has never used smokeless tobacco. She reports  that she does not drink alcohol and does not use drugs. ? ?Allergies:  ?Allergies  ?Allergen Reactions  ? Amoxicillin-Pot Clavulanate Other (See Comments)  ?  Stomach upset/pain  ? ? ?Medications: I have reviewed the patient's current medications. ? ? ?ROS - all of the below systems have been reviewed with the patient and positives are indicated with bold text ?General: chills, fever or night sweats ?Eyes: blurry vision or double vision ?ENT: epistaxis or sore  throat ?Allergy/Immunology: itchy/watery eyes or nasal congestion ?Hematologic/Lymphatic: bleeding problems, blood clots or swollen lymph nodes ?Endocrine: temperature intolerance or unexpected weight changes ?Breast: new or changing breast lumps or nipple discharge ?Resp: cough, shortness of breath, or wheezing ?CV: chest pain or dyspnea on exertion ?GI: as per HPI ?GU: dysuria, trouble voiding, or hematuria ?MSK: joint pain or joint stiffness ?Neuro: TIA or stroke symptoms ?Derm: pruritus and skin lesion changes ?Psych: anxiety and depression ? ?PE ?Blood pressure 111/70, pulse 70, temperature 97.8 ?F (36.6 ?C), temperature source Oral, resp. rate 18, weight 79 kg, last menstrual period 03/30/2021, SpO2 100 %. ?Constitutional: NAD; conversant; no deformities ?Eyes: Moist conjunctiva; no lid lag; anicteric; PERRL ?Neck: Trachea midline; no thyromegaly ?Lungs: Normal respiratory effort; no tactile fremitus ?CV: RRR; no palpable thrills; no pitting edema ?GI: Abd soft, nt, nd; old midline incision; no palpable hepatosplenomegaly ?MSK: Normal gait; no clubbing/cyanosis ?Psychiatric: Appropriate affect; alert and oriented x3 ?Lymphatic: No palpable cervical or axillary lymphadenopathy ?Skin:no rash/lesions ? ?Results for orders placed or performed during the hospital encounter of 04/28/21 (from the past 48 hour(s))  ?Pregnancy, Urine     Status: None  ? Collection Time: 04/28/21  6:17 AM  ?Result Value Ref Range  ? Preg Test, Ur NEGATIVE NEGATIVE  ?  Comment:        ?THE SENSITIVITY OF THIS ?METHODOLOGY IS >20 mIU/mL. ?Performed at [facility], [place], [address] [place], [place] [address] ?  ? ? ?No results found. ? ?Imaging: ?Reviewed ct ? ?A/P: ?Linda Shelton is an 36 y.o. female with  ?Right sided abdominal pain, unspecified ? ?Status post exploratory laparotomy ? ?History of Roux-en-Y gastric bypass ? ?Elevated LFTs ? ?Intestinal intussusception (CMS-HCC) ? ?Still recommend proceeding with  laparoscopic cholecystectomy with cholangiogram.  But since she has had some ongoing episodes of crampy abdominal pain as well I recommended also performing a diagnostic laparoscopy.  Because she has had intussusception at least on one prior imaging study I think she may be having some intermittent intussusception as well.  However I do not think we need to resect her jejunojejunostomy.  I recommended just a pexy of her Roux limb and BP limb and jejunojejunostomy.  I discussed the additional risks such as injuring surrounding intestines.  We also discussed the possibility of persistent abdominal pain even with these procedures.  She wishes to proceed with the procedures described above.  I had a long discussion with the patient in the office regarding risk of cholecystectomy.  We also discussed about the possibility of conversion to open since she has had a prior midline incision. ? ?Mary Sella. Andrey Campanile, MD, FACS ?General, Bariatric, & Minimally Invasive Surgery ?[facility] ? ? ?

## 2021-04-28 NOTE — Transfer of Care (Signed)
Immediate Anesthesia Transfer of Care Note ? ?Patient: Linda Shelton ? ?Procedure(s) Performed: LAPAROSCOPIC CHOLECYSTECTOMY (Abdomen) ?LAPAROSCOPY DIAGNOSTIC (Abdomen) ?LAPAROSCOPIC LYSIS OF ADHESIONS (Abdomen) ? ?Patient Location: PACU ? ?Anesthesia Type:General ? ?Level of Consciousness: awake, alert  and oriented ? ?Airway & Oxygen Therapy: Patient Spontanous Breathing and Patient connected to face mask oxygen ? ?Post-op Assessment: Report given to RN and Post -op Vital signs reviewed and stable ? ?Post vital signs: Reviewed and stable ? ?Last Vitals:  ?Vitals Value Taken Time  ?BP 146/87 04/28/21 1005  ?Temp    ?Pulse 70 04/28/21 1006  ?Resp 36 04/28/21 1006  ?SpO2 100 % 04/28/21 1006  ?Vitals shown include unvalidated device data. ? ?Last Pain:  ?Vitals:  ? 04/28/21 0656  ?TempSrc:   ?PainSc: 5   ?   ? ?  ? ?Complications: No notable events documented. ?

## 2021-04-29 ENCOUNTER — Encounter (HOSPITAL_COMMUNITY): Payer: Self-pay | Admitting: General Surgery

## 2021-04-29 LAB — SURGICAL PATHOLOGY

## 2022-01-05 ENCOUNTER — Other Ambulatory Visit: Payer: Self-pay

## 2022-01-05 ENCOUNTER — Encounter (HOSPITAL_COMMUNITY): Payer: Self-pay

## 2022-01-05 ENCOUNTER — Emergency Department (HOSPITAL_COMMUNITY): Payer: Medicaid Other

## 2022-01-05 ENCOUNTER — Emergency Department (HOSPITAL_COMMUNITY)
Admission: EM | Admit: 2022-01-05 | Discharge: 2022-01-06 | Disposition: A | Discharge status: Home / Self Care | Payer: Medicaid Other | Attending: Emergency Medicine | Admitting: Emergency Medicine

## 2022-01-05 DIAGNOSIS — M545 Low back pain, unspecified: Secondary | ICD-10-CM | POA: Diagnosis not present

## 2022-01-05 DIAGNOSIS — R1030 Lower abdominal pain, unspecified: Secondary | ICD-10-CM | POA: Diagnosis present

## 2022-01-05 LAB — BASIC METABOLIC PANEL
Anion gap: 9 (ref 5–15)
BUN: 10 mg/dL (ref 6–20)
CO2: 19 mmol/L — ABNORMAL LOW (ref 22–32)
Calcium: 9.3 mg/dL (ref 8.9–10.3)
Chloride: 109 mmol/L (ref 98–111)
Creatinine, Ser: 0.71 mg/dL (ref 0.44–1.00)
GFR, Estimated: >60 mL/min (ref >60)
Glucose, Bld: 78 mg/dL (ref 70–99)
Potassium: 4.1 mmol/L (ref 3.5–5.1)
Sodium: 137 mmol/L (ref 135–145)

## 2022-01-05 LAB — CBC
HCT: 36.1 % (ref 36.0–46.0)
Hemoglobin: 11.8 g/dL — ABNORMAL LOW (ref 12.0–15.0)
MCH: 28.2 pg (ref 26.0–34.0)
MCHC: 32.7 g/dL (ref 30.0–36.0)
MCV: 86.4 fL (ref 80.0–100.0)
Platelets: 288 10*3/uL (ref 150–400)
RBC: 4.18 MIL/uL (ref 3.87–5.11)
RDW: 13.3 % (ref 11.5–15.5)
WBC: 7.4 10*3/uL (ref 4.0–10.5)
nRBC: 0 % (ref 0.0–0.2)

## 2022-01-05 LAB — URINALYSIS, ROUTINE W REFLEX MICROSCOPIC
Bacteria, UA: NONE SEEN
Bilirubin Urine: NEGATIVE
Glucose, UA: NEGATIVE mg/dL
Hgb urine dipstick: NEGATIVE
Ketones, ur: NEGATIVE mg/dL
Nitrite: NEGATIVE
Protein, ur: NEGATIVE mg/dL
Specific Gravity, Urine: 1.018 (ref 1.005–1.030)
pH: 5 (ref 5.0–8.0)

## 2022-01-05 NOTE — ED Notes (Signed)
Attempted lab draw but unsuccessful, RN made aware.

## 2022-01-05 NOTE — ED Notes (Signed)
Needs HCG

## 2022-01-05 NOTE — ED Provider Triage Note (Signed)
Emergency Medicine Provider Triage Evaluation Note  Linda Shelton , a 36 y.o. female  was evaluated in triage.  Pt complains of bilateral low back pain, lower abdominal pain and nausea.  Patient reports this began on Monday.  The patient states she has history of UTIs that presented this way.  Patient also reporting that she was unable to have a bowel movement yesterday and had to take Dulcolax.  Patient denies fevers however states she has been taking Tylenol for her back pain    Review of Systems  Positive:  Negative:   Physical Exam  There were no vitals taken for this visit. Gen:   Awake, no distress   Resp:  Normal effort  MSK:   Moves extremities without difficulty  Other:    Medical Decision Making  Medically screening exam initiated at 10:09 PM.  Appropriate orders placed.  Linda Shelton was informed that the remainder of the evaluation will be completed by another provider, this initial triage assessment does not replace that evaluation, and the importance of remaining in the ED until their evaluation is complete.     Al Decant, PA-C 01/05/22 2210

## 2022-01-05 NOTE — ED Triage Notes (Signed)
Pt reports with lower back pain and lower abdominal pain since Monday. Pt states that she suspects a UTI.

## 2022-01-06 MED ORDER — OXYCODONE-ACETAMINOPHEN 5-325 MG PO TABS
1.0000 | ORAL_TABLET | Freq: Once | ORAL | Status: AC
  Administered 2022-01-06: 1 via ORAL
  Filled 2022-01-06: qty 1

## 2022-01-06 MED ORDER — ONDANSETRON 4 MG PO TBDP: 4.0000 mg | ORAL_TABLET | Freq: Three times a day (TID) | ORAL | 0 refills | Status: DC | PRN

## 2022-01-06 NOTE — Discharge Instructions (Signed)
Make sure to drink fluids.  Zofran as needed for nausea. We do recommend that you follow-up with your GI physician if symptoms not improving. Return here for new concerns.

## 2022-01-06 NOTE — ED Provider Notes (Signed)
[facility] Provider Note   CSN: [id number] Arrival date & time: 01/05/22  2137     History  Chief Complaint  Patient presents with   Back Pain   Abdominal Pain    Linda Shelton is a 36 y.o. female.  The history is provided by the patient and medical records.  Back Pain Associated symptoms: abdominal pain   Abdominal Pain  36 year old female presenting to the ED with lower abdominal and back pain since Monday.  States he feels like "spasms" in her lower stomach.  Also having pain and soreness across her lower back.  She is not having any dysuria or urinary frequency but reports she has had UTIs present similarly in the past.  She has not had any fever.  She does report emesis yesterday but none today.  Pain does seem worse after eating.  She is status post gastric bypass, cholecystectomy, appendectomy, and tubal ligation.  Does report yesterday she was having some difficulty moving her bowels, took Dulcolax which seemed to help.  Home Medications Prior to Admission medications   Medication Sig Start Date End Date Taking? Authorizing Provider  ondansetron (ZOFRAN-ODT) 4 MG disintegrating tablet Take 1 tablet (4 mg total) by mouth every 8 (eight) hours as needed for nausea. 01/06/22  Yes Garlon Hatchet, PA-C  Ascorbic Acid (VITAMIN C PO) Take 1 tablet by mouth daily.    [provider]  cetirizine (ZYRTEC) 10 MG tablet Take 10 mg by mouth daily as needed for allergies.    [provider]  cyanocobalamin 1000 MCG tablet Take 1,000 mcg by mouth daily.    [provider]  EVENING PRIMROSE OIL PO Take 1 tablet by mouth daily.    [provider]  fluticasone (FLONASE) 50 MCG/ACT nasal spray Place 1 spray into both nostrils daily as needed for allergies or rhinitis.    [provider]  KRILL OIL PO Take 1 capsule by mouth daily.    [provider]  omeprazole (PRILOSEC) 20 MG capsule Take 20 mg by  mouth daily as needed (for reflux).  08/15/18   [provider]  ondansetron (ZOFRAN) 4 MG tablet Take 1 tablet (4 mg total) by mouth every 8 (eight) hours as needed for nausea or vomiting. 04/15/21   Tegeler, Canary Brim, MD  oxyCODONE (OXY IR/ROXICODONE) 5 MG immediate release tablet Take 1 tablet (5 mg total) by mouth every 6 (six) hours as needed for severe pain. 04/28/21   [name], MD  Prenatal Vit-Fe Fumarate-FA (PRENATAL MULTIVITAMIN) TABS tablet Take 1 tablet by mouth daily at 12 noon.    [provider]  Probiotic Product (PROBIOTIC PO) Take 1 capsule by mouth daily.    [provider]      Allergies    Amoxicillin-pot clavulanate    Review of Systems   Review of Systems  Gastrointestinal:  Positive for abdominal pain.  Musculoskeletal:  Positive for back pain.  All other systems reviewed and are negative.   Physical Exam Updated Vital Signs BP 124/77   Pulse 74   Temp 98 F (36.7 C) (Oral)   Resp 16   Ht 5\' 3"  (1.6 m)   Wt 76.2 kg   SpO2 100%   BMI 29.76 kg/m   Physical Exam Vitals and nursing note reviewed.  Constitutional:      Appearance: She is well-developed.  HENT:     Head: Normocephalic and atraumatic.  Eyes:     Conjunctiva/sclera: Conjunctivae normal.  Pupils: Pupils are equal, round, and reactive to light.  Cardiovascular:     Rate and Rhythm: Normal rate and regular rhythm.     Heart sounds: Normal heart sounds.  Pulmonary:     Effort: Pulmonary effort is normal.     Breath sounds: Normal breath sounds.  Abdominal:     General: Bowel sounds are normal.     Palpations: Abdomen is soft.     Tenderness: There is no abdominal tenderness. There is no rebound.     Comments: Soft, nontender, no peritoneal signs  Musculoskeletal:        General: Normal range of motion.     Cervical back: Normal range of motion.  Skin:    General: Skin is warm and dry.  Neurological:     Mental Status: She is alert and oriented to  person, place, and time.     ED Results / Procedures / Treatments   Labs (all labs ordered are listed, but only abnormal results are displayed) Labs Reviewed  URINALYSIS, ROUTINE W REFLEX MICROSCOPIC - Abnormal; Notable for the following components:      Result Value   Leukocytes,Ua TRACE (*)    All other components within normal limits  CBC - Abnormal; Notable for the following components:   Hemoglobin 11.8 (*)    All other components within normal limits  BASIC METABOLIC PANEL - Abnormal; Notable for the following components:   CO2 19 (*)    All other components within normal limits  PREGNANCY, URINE    EKG None  Radiology CT Renal Stone Study  Result Date: 01/05/2022 CLINICAL DATA:  Abdominal/flank pain.  Stone suspected. EXAM: CT ABDOMEN AND PELVIS WITHOUT CONTRAST TECHNIQUE: Multidetector CT imaging of the abdomen and pelvis was performed following the standard protocol without IV contrast. RADIATION DOSE REDUCTION: This exam was performed according to the departmental dose-optimization program which includes automated exposure control, adjustment of the mA and/or kV according to patient size and/or use of iterative reconstruction technique. COMPARISON:  CT examination dated April 15, 2021 FINDINGS: Lower chest: No acute abnormality. Hepatobiliary: No focal liver abnormality is seen. Status post cholecystectomy. No biliary dilatation. Pancreas: Unremarkable. No pancreatic ductal dilatation or surrounding inflammatory changes. Spleen: Normal in size without focal abnormality. Adrenals/Urinary Tract: Adrenal glands are unremarkable. Kidneys are normal, without renal calculi, focal lesion, or hydronephrosis. Bladder is unremarkable. Stomach/Bowel: Postsurgical changes from prior gastric bypass surgery. Appendix not identified. Bowel loops are normal in caliber. No evidence of obstruction, colitis or diverticulitis. No evidence of bowel wall thickening, distention, or inflammatory  changes. Vascular/Lymphatic: No significant vascular findings are present. No enlarged abdominal or pelvic lymph nodes. Reproductive: Uterus and bilateral adnexa are unremarkable. Other: No abdominal wall hernia or abnormality. No abdominopelvic ascites. Musculoskeletal: No acute or significant osseous findings. IMPRESSION: 1. No evidence of nephrolithiasis or hydronephrosis. 2. Postsurgical changes from prior gastric bypass surgery and cholecystectomy. 3. No evidence of bowel obstruction, colitis or diverticulitis. Electronically Signed   By: Larose Hires D.O.   On: 01/05/2022 23:04    Procedures Procedures    Medications Ordered in ED Medications  oxyCODONE-acetaminophen (PERCOCET/ROXICET) 5-325 MG per tablet 1 tablet (1 tablet Oral Given 01/06/22 0044)    ED Course/ Medical Decision Making/ A&P                           Medical Decision Making Amount and/or Complexity of Data Reviewed Labs: ordered. Radiology: ordered and independent interpretation performed. ECG/medicine  tests: ordered and independent interpretation performed.  Risk Prescription drug management.   36 year old female presenting to the ED with lower abdominal and back pain to the past 3 days.  She is afebrile and nontoxic in appearance.  Her abdomen is soft and nontender, no peritoneal signs.  Her labs today are overall reassuring without leukocytosis or electrolyte derangement.  Her UA does not show any evidence of infection.  CT renal stone study obtained from triage and is negative for any acute findings.  Stable changes from prior gastric bypass and cholecystectomy.  Patient denies any sick contacts with similar symptoms, no viral exposures she is aware of.  She declined covid/flu testing.  As no acute findings today, feel she is stable for discharge home with OP follow-up.  She does have GI physician that she follows with, encouraged close follow-up if symptoms worsening or not improving.  Can return here for new  concerns.  Final Clinical Impression(s) / ED Diagnoses Final diagnoses:  Lower abdominal pain    Rx / DC Orders ED Discharge Orders          Ordered    ondansetron (ZOFRAN-ODT) 4 MG disintegrating tablet  Every 8 hours PRN        01/06/22 0059              Garlon Hatchet, PA-C 01/06/22 0120    Palumbo, April, MD 01/06/22 7591

## 2022-05-16 IMAGING — NM NM HEPATOBILIARY IMAGE, INC GB
2 series · 12 of 12 positions shown · non-contrast
Comparison: None.

CLINICAL DATA: Abdominal pain.

EXAM:
NUCLEAR MEDICINE HEPATOBILIARY IMAGING
TECHNIQUE: Sequential images of the abdomen were obtained [DATE] minutes
following intravenous administration of radiopharmaceutical.
RADIOPHARMACEUTICALS:  5.1 mCi Hc-99m  Choletec IV

[Series 1: post morphine · 3.28mm/px · 6 of 30 frames shown]
[frame 3/30]
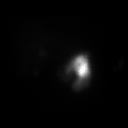
[frame 8/30]
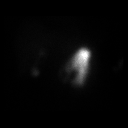
[frame 13/30]
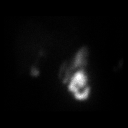
[frame 18/30]
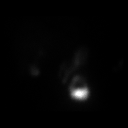
[frame 23/30]
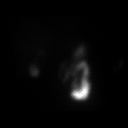
[frame 28/30]
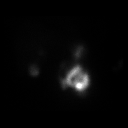

[Series 1: hida scan · 3.28mm/px · 6 of 60 frames shown]
[frame 6/60]
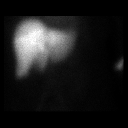
[frame 16/60]
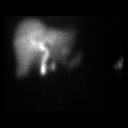
[frame 26/60]
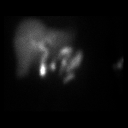
[frame 36/60]
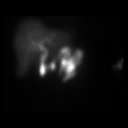
[frame 46/60]
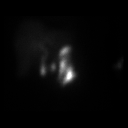
[frame 56/60]
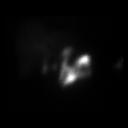

[12 of 12 positions shown; findings below may reference images not displayed]

FINDINGS: Prompt uptake and biliary excretion of activity by the liver is
seen. Biliary activity passes into small bowel, consistent with
patent common bile duct. After 1 hour of imaging there was
nonvisualization of the gallbladder. The patient subsequently
received 3 mg morphine bolus and imaging was performed for an
additional 30 minutes. After morphine administration gallbladder
activity is visualized, consistent with patency of cystic duct.
IMPRESSION: 1. Patent cystic duct without evidence for acute cholecystitis.

## 2022-07-31 ENCOUNTER — Encounter (HOSPITAL_COMMUNITY): Payer: Self-pay

## 2022-07-31 ENCOUNTER — Emergency Department (HOSPITAL_COMMUNITY)
Admission: EM | Admit: 2022-07-31 | Discharge: 2022-08-01 | Disposition: A | Discharge status: Home / Self Care | Payer: Medicaid Other | Attending: Emergency Medicine | Admitting: Emergency Medicine

## 2022-07-31 ENCOUNTER — Other Ambulatory Visit: Payer: Self-pay

## 2022-07-31 ENCOUNTER — Emergency Department (HOSPITAL_COMMUNITY): Payer: Medicaid Other

## 2022-07-31 DIAGNOSIS — R0989 Other specified symptoms and signs involving the circulatory and respiratory systems: Secondary | ICD-10-CM | POA: Diagnosis not present

## 2022-07-31 DIAGNOSIS — D649 Anemia, unspecified: Secondary | ICD-10-CM | POA: Insufficient documentation

## 2022-07-31 DIAGNOSIS — R0789 Other chest pain: Secondary | ICD-10-CM | POA: Diagnosis present

## 2022-07-31 DIAGNOSIS — R002 Palpitations: Secondary | ICD-10-CM | POA: Diagnosis not present

## 2022-07-31 LAB — CBG MONITORING, ED: Glucose-Capillary: 76 mg/dL (ref 70–99)

## 2022-07-31 NOTE — ED Triage Notes (Signed)
Pt states that she has had palpitations with related chest tightness intermittently x 4 days. Denies Sob. Denies SOB. Pt also complains taht she had a hypoglycemic event tonight prior to coming in where she felt shakey and diaphoretic, CBG in triage was 76

## 2022-08-01 LAB — RAPID URINE DRUG SCREEN, HOSP PERFORMED
Amphetamines: NOT DETECTED
Barbiturates: NOT DETECTED
Benzodiazepines: NOT DETECTED
Cocaine: NOT DETECTED
Opiates: NOT DETECTED
Tetrahydrocannabinol: NOT DETECTED

## 2022-08-01 LAB — CBC
HCT: 32.6 % — ABNORMAL LOW (ref 36.0–46.0)
Hemoglobin: 10.5 g/dL — ABNORMAL LOW (ref 12.0–15.0)
MCH: 26.2 pg (ref 26.0–34.0)
MCHC: 32.2 g/dL (ref 30.0–36.0)
MCV: 81.3 fL (ref 80.0–100.0)
Platelets: 302 10*3/uL (ref 150–400)
RBC: 4.01 MIL/uL (ref 3.87–5.11)
RDW: 13.8 % (ref 11.5–15.5)
WBC: 8.3 10*3/uL (ref 4.0–10.5)
nRBC: 0 % (ref 0.0–0.2)

## 2022-08-01 LAB — BASIC METABOLIC PANEL
Anion gap: 9 (ref 5–15)
BUN: 15 mg/dL (ref 6–20)
CO2: 21 mmol/L — ABNORMAL LOW (ref 22–32)
Calcium: 9 mg/dL (ref 8.9–10.3)
Chloride: 105 mmol/L (ref 98–111)
Creatinine, Ser: 0.67 mg/dL (ref 0.44–1.00)
GFR, Estimated: >60 mL/min (ref >60)
Glucose, Bld: 82 mg/dL (ref 70–99)
Potassium: 4 mmol/L (ref 3.5–5.1)
Sodium: 135 mmol/L (ref 135–145)

## 2022-08-01 LAB — TSH: TSH: 0.93 u[IU]/mL (ref 0.350–4.500)

## 2022-08-01 LAB — HCG, SERUM, QUALITATIVE: Preg, Serum: NEGATIVE

## 2022-08-01 LAB — TROPONIN I (HIGH SENSITIVITY)
Troponin I (High Sensitivity): 2 ng/L (ref <18)
Troponin I (High Sensitivity): <2 ng/L (ref <18)

## 2022-08-01 LAB — MAGNESIUM: Magnesium: 2.1 mg/dL (ref 1.7–2.4)

## 2022-08-01 MED ORDER — GUAIFENESIN ER 600 MG PO TB12: 600.0000 mg | ORAL_TABLET | Freq: Two times a day (BID) | ORAL | 0 refills | Status: DC

## 2022-08-01 NOTE — ED Provider Notes (Signed)
  [facility]-EMERGENCY DEPT [facility] Emergency Department Provider Note MRN:  [id number]  Arrival date & time: 08/01/22     Chief Complaint   Chest Pain   History of Present Illness   Linda Shelton is a 37 y.o. year-old female presents to the ED with chief complaint of chest pain.  States that she has had palpitations for the past few days.  States that she felt like her blood sugar was low today and then she began having chest pain and this concerned her and brought her to the ER for evaluation.  She describes a tightness in her chest and SOB.  She denies fever or chills, but has had some cough.  She states that she drinks minimal amounts of coffee and denies other caffeine or stimulant use.  History provided by patient. {RB interpreter (Optional):27221}  Review of Systems  Pertinent positive and negative review of systems noted in HPI.    Physical Exam   Vitals:   07/31/22 2320  BP: (!) 137/100  Pulse: 97  Resp: 18  Temp: (!) 97.5 F (36.4 C)  SpO2: 99%    CONSTITUTIONAL:  anxious-appearing, NAD NEURO:  Alert and oriented x 3, CN 3-12 grossly intact EYES:  eyes equal and reactive ENT/NECK:  Supple, no stridor  CARDIO:  normal rate, regular rhythm, appears well-perfused  PULM:  No respiratory distress, CTAB GI/GU:  non-distended,  MSK/SPINE:  No gross deformities, no edema, moves all extremities  SKIN:  no rash, atraumatic   *Additional and/or pertinent findings included in MDM below  Diagnostic and Interventional Summary    EKG Interpretation Date/Time:  Sunday July 31 2022 23:25:51 EDT Ventricular Rate:  88 PR Interval:  123 QRS Duration:  86 QT Interval:  365 QTC Calculation: 442 R Axis:   59  Text Interpretation: Sinus arrhythmia Ventricular premature complex No significant change was found Confirmed by Glynn Octave [phone]) on 07/31/2022 11:35:45 PM       Labs Reviewed  BASIC METABOLIC PANEL  CBC  HCG, SERUM, QUALITATIVE  RAPID URINE DRUG SCREEN,  HOSP PERFORMED  TSH  MAGNESIUM  CBG MONITORING, ED  CBG MONITORING, ED  TROPONIN I (HIGH SENSITIVITY)    DG Chest 2 View  Final Result      Medications - No data to display   Procedures  /  Critical Care Procedures  ED Course and Medical Decision Making  I have reviewed the triage vital signs, the nursing notes, and pertinent available records from the EMR.  Social Determinants Affecting Complexity of Care: Patient {rbSocial Determinants:27067}. {rbsocialsolutions:27068}  ED Course:    Medical Decision Making Amount and/or Complexity of Data Reviewed Labs: ordered. Radiology: ordered.      {rbcpddx (Optional):29772:::1} {rbabdddx (Optional):29773:s::1}  Consultants: {rbconsultants:27072}   Treatment and Plan: {rbadmissionvdc:27069}  {rbattending:27073}  Final Clinical Impressions(s) / ED Diagnoses  No diagnosis found.  ED Discharge Orders     None         Discharge Instructions Discussed with and Provided to Patient:   Discharge Instructions   None

## 2022-09-07 IMAGING — CT CT ABD-PELV W/ CM
3 of 4 series · 11 of 46 positions shown, 16 images · IV contrast (APPLIED)
Comparison: CT June 13, 2020 and MRI December 22, 2020

CLINICAL DATA: Lower abdominal pain and back pain with CVA
tenderness.

EXAM:
CT ABDOMEN AND PELVIS WITH CONTRAST
TECHNIQUE: Multidetector CT imaging of the abdomen and pelvis was performed
using the standard protocol following bolus administration of
intravenous contrast.

[Series 3: abdomen 5.0 · axial · 0.85mm/px · z∈[+850,+1194]mm · 7 of 93 slices shown, 12 images]
[im 12/93  soft-tissue]
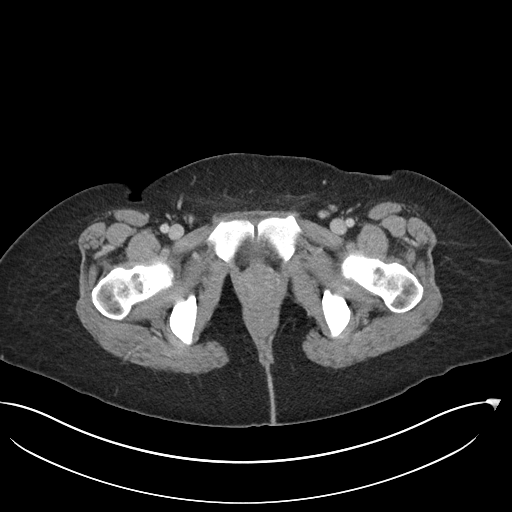
[im 12/93  bone]
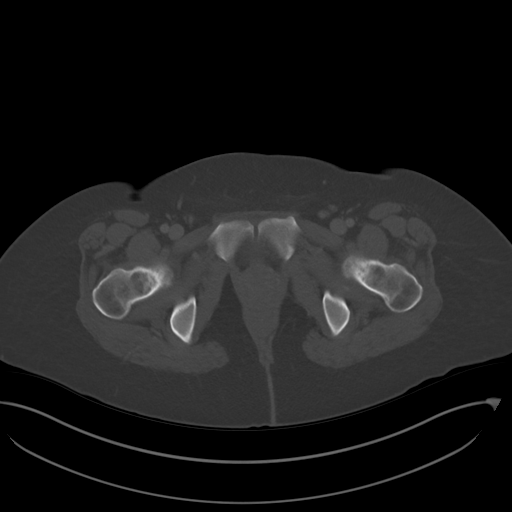
[im 24/93  soft-tissue]
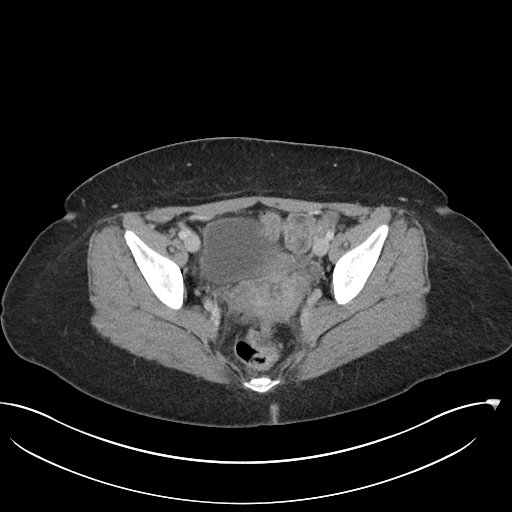
[im 35/93  soft-tissue]
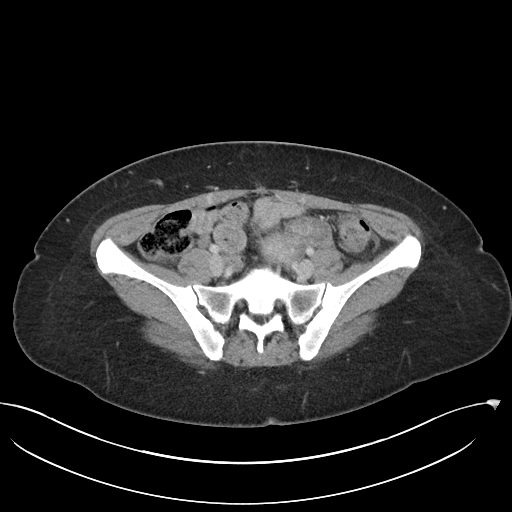
[im 47/93  soft-tissue]
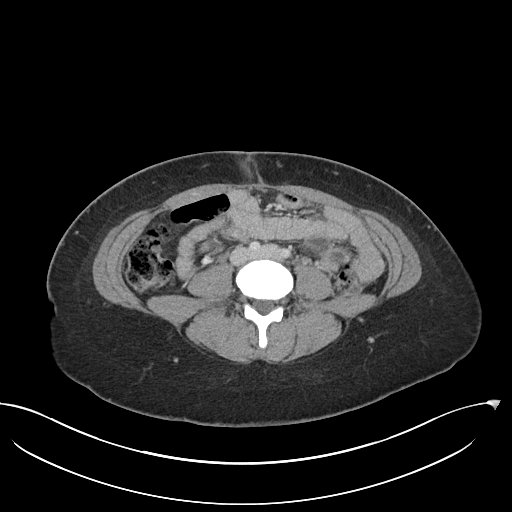
[im 47/93  lung]
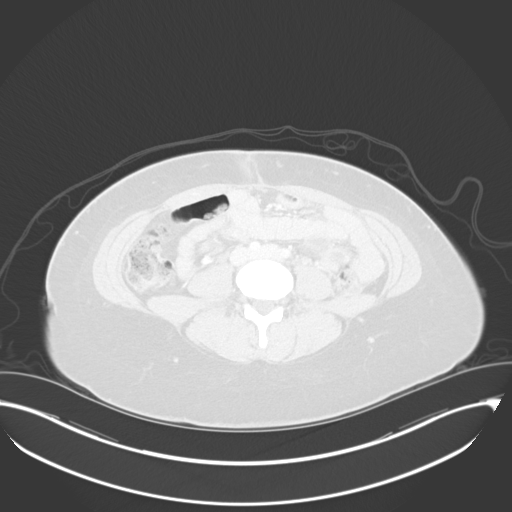
[im 58/93  soft-tissue]
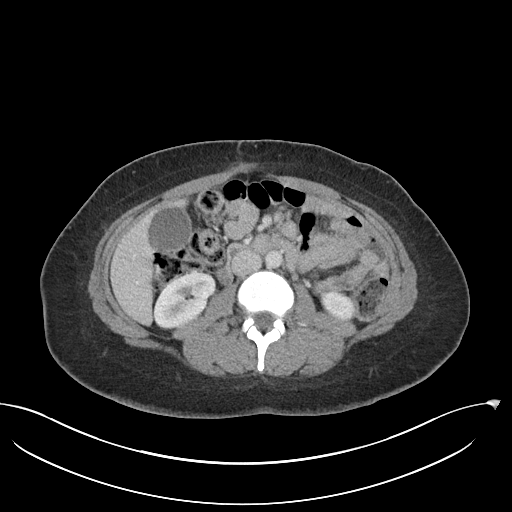
[im 58/93  lung]
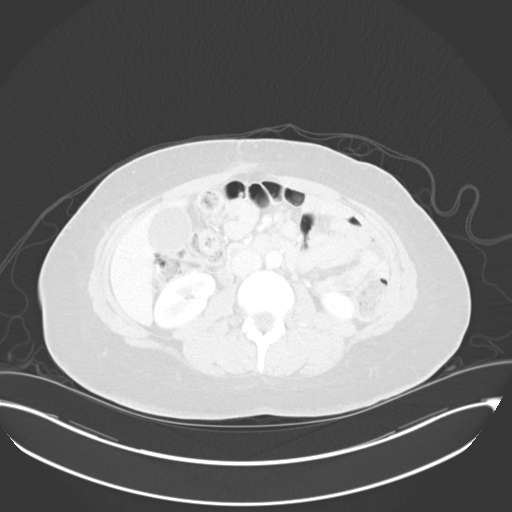
[im 70/93  soft-tissue]
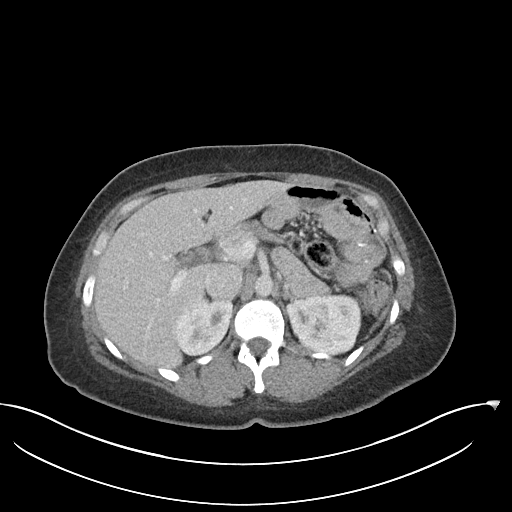
[im 70/93  lung]
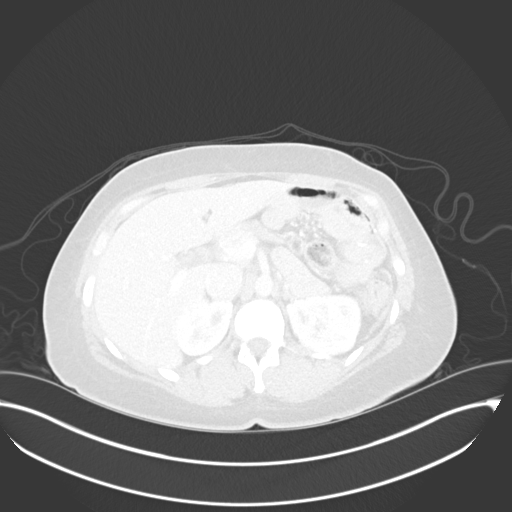
[im 81/93  soft-tissue]
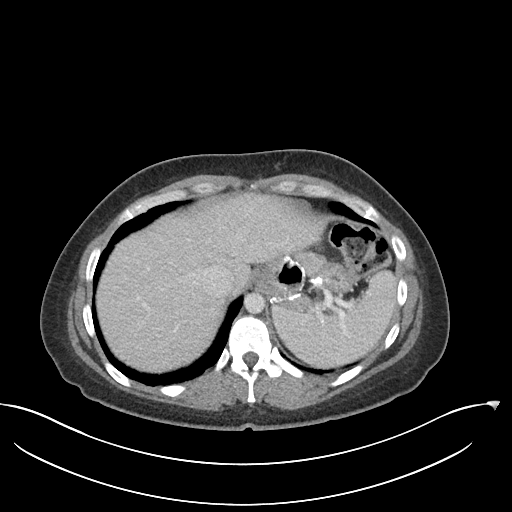
[im 81/93  lung]
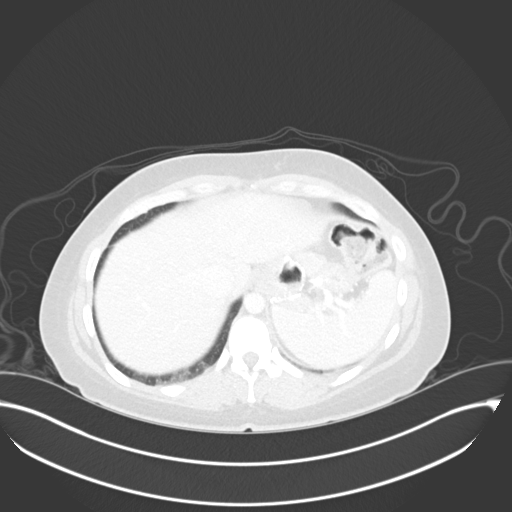

[Series 6: abdomen 3.0 mpr cor · coronal · 0.90mm/px · 3 of 84 slices shown]
[im 28/84  soft-tissue]
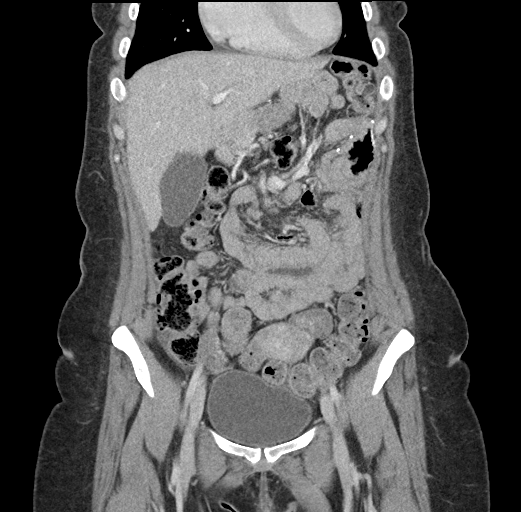
[im 37/84  soft-tissue]
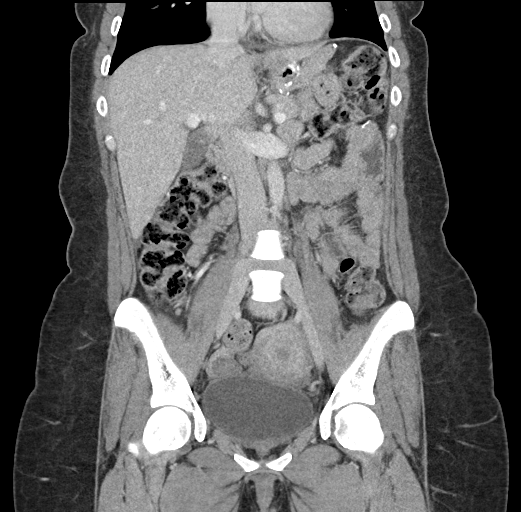
[im 47/84  soft-tissue]
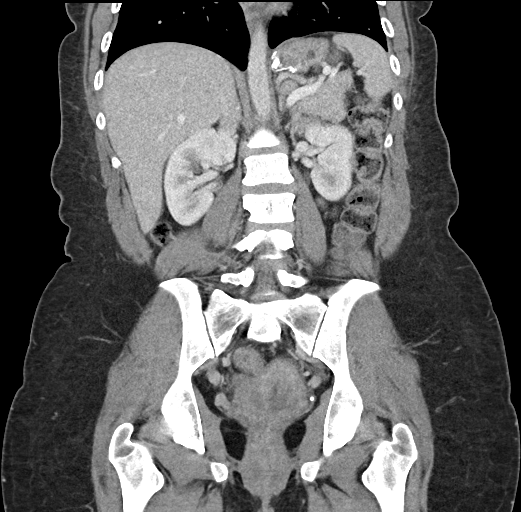

[Series 7: abdomen 3.0 mpr sag · sagittal · 0.49mm/px · 1 of 157 slices shown]
[im 53/157  soft-tissue]
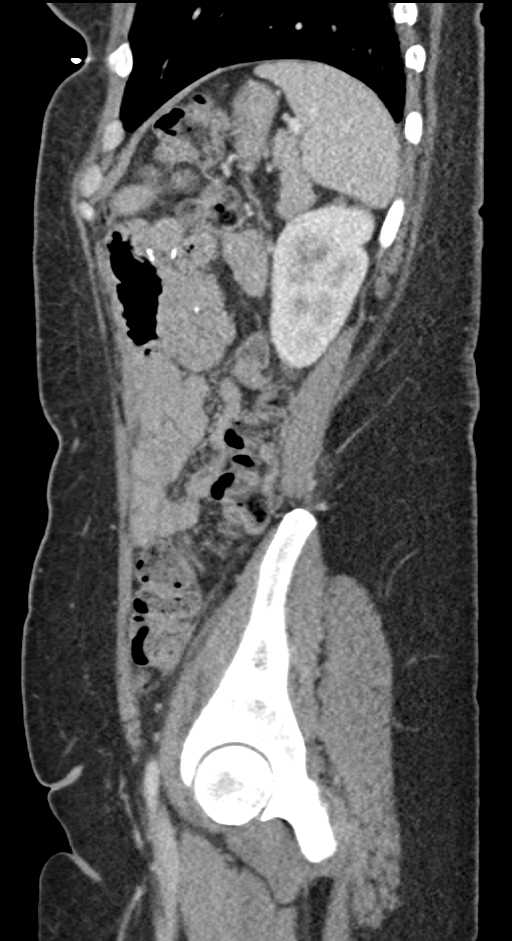

[11 of 46 positions shown; findings below may reference images not displayed]

RADIATION DOSE REDUCTION: This exam was performed according to the
departmental dose-optimization program which includes automated
exposure control, adjustment of the mA and/or kV according to
patient size and/or use of iterative reconstruction technique.

CONTRAST:  100mL OMNIPAQUE IOHEXOL 350 MG/ML SOLN
FINDINGS: Lower chest: No acute abnormality.

Hepatobiliary: Subtle 16 mm hypodense lesion in the inferior right
lobe of the liver previously characterized on MRI December 22, 2020
as a probable benign hepatic adenoma or focal nodular hyperplasia.
No new suspicious hepatic lesions. Gallbladder is unremarkable. No
biliary ductal dilation.

Pancreas: No pancreatic ductal dilation or evidence of acute
inflammation.

Spleen: No splenomegaly or focal splenic lesion.

Adrenals/Urinary Tract: Bilateral adrenal glands appear normal. No
hydronephrosis. Kidneys demonstrate symmetric enhancement. Urinary
bladder is unremarkable for degree of distension.

Stomach/Bowel: Postsurgical change of prior Roux-en-Y gastric bypass
without evidence of obstruction or acute complication. No pathologic
dilation of small or large bowel. No evidence of acute bowel
inflammation. Small volume of formed stool throughout the colon. The
appendix is surgically absent.

Vascular/Lymphatic: Normal caliber abdominal aorta. No
pathologically enlarged abdominal or pelvic lymph nodes.

Reproductive: Uterus and bilateral adnexa are unremarkable.

Other: No significant abdominopelvic free fluid. No
pneumoperitoneum. Postsurgical change in the abdominal wall.

Musculoskeletal: No acute or significant osseous findings.
IMPRESSION: 1. No acute abdominopelvic pathology.
2. Postsurgical change of prior Roux-en-Y gastric bypass without
evidence of obstruction or acute complication.
3. Subtle 16 mm hypodense lesion in the inferior right lobe of the
liver previously characterized on MRI December 22, 2020 as a
probable benign hepatic adenoma or focal nodular hyperplasia.

## 2022-10-31 ENCOUNTER — Other Ambulatory Visit: Payer: Self-pay

## 2022-10-31 ENCOUNTER — Emergency Department (HOSPITAL_COMMUNITY)
Admission: EM | Admit: 2022-10-31 | Discharge: 2022-10-31 | Disposition: A | Discharge status: Home / Self Care | Payer: Medicaid Other | Attending: Emergency Medicine | Admitting: Emergency Medicine

## 2022-10-31 ENCOUNTER — Emergency Department (HOSPITAL_COMMUNITY): Payer: Medicaid Other

## 2022-10-31 ENCOUNTER — Encounter (HOSPITAL_COMMUNITY): Payer: Self-pay

## 2022-10-31 DIAGNOSIS — M545 Low back pain, unspecified: Secondary | ICD-10-CM

## 2022-10-31 LAB — URINALYSIS, ROUTINE W REFLEX MICROSCOPIC
Bilirubin Urine: NEGATIVE
Glucose, UA: NEGATIVE mg/dL
Hgb urine dipstick: NEGATIVE
Ketones, ur: NEGATIVE mg/dL
Leukocytes,Ua: NEGATIVE
Nitrite: NEGATIVE
Protein, ur: NEGATIVE mg/dL
Specific Gravity, Urine: 1.011 (ref 1.005–1.030)
pH: 6 (ref 5.0–8.0)

## 2022-10-31 LAB — CBC
HCT: 32.4 % — ABNORMAL LOW (ref 36.0–46.0)
Hemoglobin: 10.6 g/dL — ABNORMAL LOW (ref 12.0–15.0)
MCH: 25.7 pg — ABNORMAL LOW (ref 26.0–34.0)
MCHC: 32.7 g/dL (ref 30.0–36.0)
MCV: 78.5 fL — ABNORMAL LOW (ref 80.0–100.0)
Platelets: 361 10*3/uL (ref 150–400)
RBC: 4.13 MIL/uL (ref 3.87–5.11)
RDW: 15.7 % — ABNORMAL HIGH (ref 11.5–15.5)
WBC: 5.4 10*3/uL (ref 4.0–10.5)
nRBC: 0 % (ref 0.0–0.2)

## 2022-10-31 LAB — BASIC METABOLIC PANEL
Anion gap: 9 (ref 5–15)
BUN: 9 mg/dL (ref 6–20)
CO2: 24 mmol/L (ref 22–32)
Calcium: 9.2 mg/dL (ref 8.9–10.3)
Chloride: 103 mmol/L (ref 98–111)
Creatinine, Ser: 0.82 mg/dL (ref 0.44–1.00)
GFR, Estimated: >60 mL/min (ref >60)
Glucose, Bld: 86 mg/dL (ref 70–99)
Potassium: 3.4 mmol/L — ABNORMAL LOW (ref 3.5–5.1)
Sodium: 136 mmol/L (ref 135–145)

## 2022-10-31 LAB — HCG, SERUM, QUALITATIVE: Preg, Serum: NEGATIVE

## 2022-10-31 MED ORDER — ACETAMINOPHEN 500 MG PO TABS
500.0000 mg | ORAL_TABLET | Freq: Once | ORAL | Status: AC
  Administered 2022-10-31: 500 mg via ORAL
  Filled 2022-10-31: qty 1

## 2022-10-31 MED ORDER — ACETAMINOPHEN 500 MG PO TABS
500.0000 mg | ORAL_TABLET | ORAL | Status: AC
  Administered 2022-10-31: 500 mg via ORAL
  Filled 2022-10-31: qty 1

## 2022-10-31 NOTE — ED Notes (Signed)
Awaiting patient from lobby 

## 2022-10-31 NOTE — ED Notes (Signed)
Patient transported to CT 

## 2022-10-31 NOTE — Discharge Instructions (Signed)
Alferd Apa:  Thank you for allowing [place] to take care of you today.  We hope you begin feeling better soon. You were seen today for low back pain.  There is no evidence of urinary tract infection nor there are any abnormalities on your CT scan that would account for your symptoms.  To-Do: Please follow-up with your primary doctor to schedule an appointment with a new primary care doctor within the next 2-3 days.  Please return to the Emergency Department or call 911 if you experience chest pain, shortness of breath, severe pain, severe fever, altered mental status, or have any reason to think that you need emergency medical care.  Thank you again.  Hope you feel better soon.

## 2022-10-31 NOTE — ED Provider Notes (Signed)
  Physical Exam  BP 101/62 (BP Location: Left Arm)   Pulse 83   Temp 98.8 F (37.1 C) (Oral)   Resp 20   Ht 5\' 3"  (1.6 m)   Wt 76.2 kg   SpO2 100%   BMI 29.76 kg/m   Physical Exam Constitutional:      General: She is not in acute distress.    Appearance: She is not ill-appearing.  Abdominal:     Tenderness: There is no abdominal tenderness. There is right CVA tenderness. There is no left CVA tenderness, guarding or rebound.  Neurological:     General: No focal deficit present.     Mental Status: She is alert. Mental status is at baseline.      Procedures  Procedures  ED Course / MDM   Clinical Course as of 10/31/22 1545  Mon Oct 31, 2022  1542 S. Bilateral low back pain x 9 days. Negative UTI. Still having Bms. Numerous prior abd surgeries. CT pending_ [KM]    Clinical Course User Index [KM] [name], MD   Medical Decision Making Amount and/or Complexity of Data Reviewed Labs: ordered. Radiology: ordered.  Risk OTC drugs.   Signout taken from outgoing ED team.  In brief, this is a 37 year old female with numerous past abdominal surgeries including cholecystectomy, lysis of adhesions, appendectomy, gastric bypass, tubal ligation presenting for evaluation of low back pain for the last 1.5 weeks.  She is still having bowel movements and passing gas.  ED workup reviewed.  No evidence of leukocytosis.  Hemoglobin is mildly low at 10.6, but this is consistent with her baseline from 3 months ago.  No gross electrolyte derangements.  Not an AKI.  Urinalysis not suggestive of infection.  She is pregnancy negative.  Given degree of patient's pain and numerous surgeries, CT scan of the abdomen was obtained.  No renal stones or hydronephrosis was visualized.  There were findings from prior surgical changes.  In addition, radiology commented but dilated small bowel loops in the left abdomen near where her surgical anastomosis was.  This was favored to represent denervation  dilatation rather than SBO given that there are no other dilated loops of bowel.  Patient reiterates that she is still having bowel movements.  On reassessment, patient is well-appearing and in no acute distress.  She had some mild right-sided CVA paraspinal muscle tenderness, but no rebound or guarding.  No fevers.  We discussed the above findings in detail, and patient is relieved.  She states that she has great primary care follow-up and is asking to go home.  I do feel that this is reasonable given the above findings. I estimate there is low risk for acute appendicitis, bowel obstruction, cholecystitis, diverticulitis, incarcerated hernia, mesenteric ischemia, pancreatitis, perforated bowl or ulcer, or abdominal aortic aneurism, thus I consider the discharge disposition reasonable. Also, there is no evidence or peritonitis, sepsis or toxicity. We have discussed the diagnosis and risks and agree with discharging home to follow-up with their primary doctor. We also discussed returning to the Emergency Department immediately if new or worsening symptoms occur and have discussed the symptoms which are most concerning (e.g., bloody stool, fever, changing or worsening pain, vomiting) that necessitate immediate return.        [name], MD 10/31/22 1639    Gerhard Munch, MD 11/01/22 [phone]

## 2022-10-31 NOTE — ED Notes (Signed)
Pt reports she has been having flank pain for the past 8-9 days.  Pt states it has gotten worse on the right side.  Pt states that she had a "UTI and took antibiotics sold in her home country for a couple days."

## 2022-10-31 NOTE — ED Provider Notes (Cosign Needed Addendum)
[place] EMERGENCY DEPARTMENT AT [facility] Provider Note   CSN: [id number] Arrival date & time: 10/31/22  0865     History  Chief Complaint  Patient presents with   Back Pain    Linda Shelton is a 37 y.o. female who presents with concern for bilateral lower back pain ongoing for about 9 days.  Describes this as a needle prick sensation in her kidneys.  She says she self treated for a UTI about a week ago but this pain has been ongoing.  Denies any hematuria or dysuria.  Denies any nausea or vomiting, abdominal pain.  Reports normal bowel movements.  Reports multiple abdominal surgeries including cholecystectomy, laparoscopic lysis of adhesions, appendectomy, gastric bypass, tubal ligation   Back Pain      Home Medications Prior to Admission medications   Medication Sig Start Date End Date Taking? Authorizing Provider  Ascorbic Acid (VITAMIN C PO) Take 1 tablet by mouth daily.    [provider]  cetirizine (ZYRTEC) 10 MG tablet Take 10 mg by mouth daily as needed for allergies.    [provider]  cyanocobalamin 1000 MCG tablet Take 1,000 mcg by mouth daily.    [provider]  fluticasone (FLONASE) 50 MCG/ACT nasal spray Place 1 spray into both nostrils daily as needed for allergies or rhinitis.    [provider]  guaiFENesin (MUCINEX) 600 MG 12 hr tablet Take 1 tablet (600 mg total) by mouth 2 (two) times daily. 08/01/22   Roxy Horseman, PA-C  KRILL OIL PO Take 1 capsule by mouth daily.    [provider]  ondansetron (ZOFRAN) 4 MG tablet Take 1 tablet (4 mg total) by mouth every 8 (eight) hours as needed for nausea or vomiting. Patient not taking: Reported on 08/01/2022 04/15/21   Tegeler, Canary Brim, MD  ondansetron (ZOFRAN-ODT) 4 MG disintegrating tablet Take 1 tablet (4 mg total) by mouth every 8 (eight) hours as needed for nausea. Patient not taking: Reported on 08/01/2022 01/06/22   Garlon Hatchet, PA-C   oxyCODONE (OXY IR/ROXICODONE) 5 MG immediate release tablet Take 1 tablet (5 mg total) by mouth every 6 (six) hours as needed for severe pain. Patient not taking: Reported on 08/01/2022 04/28/21   [name], MD  pantoprazole (PROTONIX) 40 MG tablet Take 40 mg by mouth 2 (two) times daily.    [provider]  Prenatal Vit-Fe Fumarate-FA (PRENATAL MULTIVITAMIN) TABS tablet Take 1 tablet by mouth daily at 12 noon.    [provider]  Probiotic Product (PROBIOTIC PO) Take 1 capsule by mouth daily.    [provider]      Allergies    Amoxicillin-pot clavulanate    Review of Systems   Review of Systems  Musculoskeletal:  Positive for back pain.    Physical Exam Updated Vital Signs BP 101/62 (BP Location: Left Arm)   Pulse 83   Temp 98.8 F (37.1 C) (Oral)   Resp 20   Ht 5\' 3"  (1.6 m)   Wt 76.2 kg   SpO2 100%   BMI 29.76 kg/m  Physical Exam Vitals and nursing note reviewed.  Constitutional:      Appearance: Normal appearance.  HENT:     Head: Atraumatic.  Cardiovascular:     Rate and Rhythm: Normal rate and regular rhythm.  Pulmonary:     Effort: Pulmonary effort is normal.  Abdominal:     Comments: CVA tenderness on the right and left Abdomen soft and nontender, no  rebound or guarding  Musculoskeletal:     Comments: No spinous process tenderness to palpation, no tenderness palpation of the paraspinal muscles  Neurological:     General: No focal deficit present.     Mental Status: She is alert.  Psychiatric:        Mood and Affect: Mood normal.        Behavior: Behavior normal.     ED Results / Procedures / Treatments   Labs (all labs ordered are listed, but only abnormal results are displayed) Labs Reviewed  BASIC METABOLIC PANEL - Abnormal; Notable for the following components:      Result Value   Potassium 3.4 (*)    All other components within normal limits  CBC - Abnormal; Notable for the following components:   Hemoglobin 10.6  (*)    HCT 32.4 (*)    MCV 78.5 (*)    MCH 25.7 (*)    RDW 15.7 (*)    All other components within normal limits  URINALYSIS, ROUTINE W REFLEX MICROSCOPIC  HCG, SERUM, QUALITATIVE    EKG None  Radiology No results found.  Procedures Procedures    Medications Ordered in ED Medications - No data to display  ED Course/ Medical Decision Making/ A&P Clinical Course as of 10/31/22 1546  Mon Oct 31, 2022  1542 S. Bilateral low back pain x 9 days. Negative UTI. Still having Bms. Numerous prior abd surgeries. CT pending_ [KM]    Clinical Course User Index [KM] [name], MD                                 Medical Decision Making Amount and/or Complexity of Data Reviewed Labs: ordered. Radiology: ordered.   37 y.o. female with pertinent past medical history of multiple prior abdominal surgeries presents to the ED for concern of pinprick sensations to the bilateral lower back for the past week, concerned about her kidneys  Differential diagnosis includes but is not limited to musculoskeletal lower back pain, UTI, pyelonephritis, nephrolithiasis, shingles  ED Course:  Patient overall well-appearing with normal vital signs.  She does not have any spinal tenderness to palpation, no tenderness palpation of the paraspinal muscles.  Does have bilateral CVA tenderness.  Given this bilateral and with no skin rashes, no concern for shingles at this time.  Her workup is overall reassuring, CBC without leukocytosis, BMP with normal creatinine, urinalysis without any signs of UTI.  She states concern for her kidneys and an intra-abdominal pathology given her history of abdominal surgeries.  We will proceed with CT renal study for further evaluation.   Impression: Bilateral lower back pain  Disposition:  Care of this patient signed out to oncoming ED provider Dr. Ledon Snare. Disposition and treatment plan pending imaging results and clinical judgment of oncoming ED team.    Lab  Tests: I Ordered, and personally interpreted labs.  The pertinent results include:   CBC without leukocytosis BMP without elevation in creatinine Urinalysis unremarkable  Imaging Studies ordered: I ordered imaging studies including CT renal, images pending            Final Clinical Impression(s) / ED Diagnoses Final diagnoses:  None    Rx / DC Orders ED Discharge Orders     None         Arabella Merles, PA-C 10/31/22 1541    Arabella Merles, PA-C 10/31/22 1542    Arabella Merles, PA-C 10/31/22 1547  Derwood Kaplan, MD 11/01/22 1423

## 2022-10-31 NOTE — ED Triage Notes (Signed)
Pt states she's having pain in kidney areas of backx8-9d. Pt c/o dysuria

## 2023-01-10 ENCOUNTER — Other Ambulatory Visit: Payer: Self-pay

## 2023-01-10 ENCOUNTER — Encounter (HOSPITAL_COMMUNITY): Payer: Self-pay

## 2023-01-10 ENCOUNTER — Emergency Department (HOSPITAL_COMMUNITY)
Admission: EM | Admit: 2023-01-10 | Discharge: 2023-01-10 | Disposition: A | Discharge status: Home / Self Care | Payer: Medicaid Other | Attending: Emergency Medicine | Admitting: Emergency Medicine

## 2023-01-10 DIAGNOSIS — R197 Diarrhea, unspecified: Secondary | ICD-10-CM | POA: Insufficient documentation

## 2023-01-10 DIAGNOSIS — R109 Unspecified abdominal pain: Secondary | ICD-10-CM | POA: Diagnosis present

## 2023-01-10 LAB — CBC WITH DIFFERENTIAL/PLATELET
Abs Immature Granulocytes: 0.03 10*3/uL (ref 0.00–0.07)
Basophils Absolute: 0 10*3/uL (ref 0.0–0.1)
Basophils Relative: 0 %
Eosinophils Absolute: 0 10*3/uL (ref 0.0–0.5)
Eosinophils Relative: 0 %
HCT: 28.7 % — ABNORMAL LOW (ref 36.0–46.0)
Hemoglobin: 9.5 g/dL — ABNORMAL LOW (ref 12.0–15.0)
Immature Granulocytes: 0 %
Lymphocytes Relative: 19 %
Lymphs Abs: 1.4 10*3/uL (ref 0.7–4.0)
MCH: 25.1 pg — ABNORMAL LOW (ref 26.0–34.0)
MCHC: 33.1 g/dL (ref 30.0–36.0)
MCV: 75.9 fL — ABNORMAL LOW (ref 80.0–100.0)
Monocytes Absolute: 0.4 10*3/uL (ref 0.1–1.0)
Monocytes Relative: 5 %
Neutro Abs: 5.7 10*3/uL (ref 1.7–7.7)
Neutrophils Relative %: 76 %
Platelets: 351 10*3/uL (ref 150–400)
RBC: 3.78 MIL/uL — ABNORMAL LOW (ref 3.87–5.11)
RDW: 15.7 % — ABNORMAL HIGH (ref 11.5–15.5)
WBC: 7.6 10*3/uL (ref 4.0–10.5)
nRBC: 0 % (ref 0.0–0.2)

## 2023-01-10 LAB — COMPREHENSIVE METABOLIC PANEL
ALT: 26 U/L (ref 0–44)
AST: 47 U/L — ABNORMAL HIGH (ref 15–41)
Albumin: 4.1 g/dL (ref 3.5–5.0)
Alkaline Phosphatase: 105 U/L (ref 38–126)
Anion gap: 10 (ref 5–15)
BUN: 11 mg/dL (ref 6–20)
CO2: 23 mmol/L (ref 22–32)
Calcium: 9 mg/dL (ref 8.9–10.3)
Chloride: 105 mmol/L (ref 98–111)
Creatinine, Ser: 0.6 mg/dL (ref 0.44–1.00)
GFR, Estimated: >60 mL/min (ref >60)
Glucose, Bld: 83 mg/dL (ref 70–99)
Potassium: 3.5 mmol/L (ref 3.5–5.1)
Sodium: 138 mmol/L (ref 135–145)
Total Bilirubin: 0.8 mg/dL (ref <1.2)
Total Protein: 8.1 g/dL (ref 6.5–8.1)

## 2023-01-10 LAB — I-STAT CG4 LACTIC ACID, ED: Lactic Acid, Venous: 0.9 mmol/L (ref 0.5–1.9)

## 2023-01-10 MED ORDER — FAMOTIDINE 20 MG PO TABS
20.0000 mg | ORAL_TABLET | Freq: Once | ORAL | Status: AC
  Administered 2023-01-10: 20 mg via ORAL
  Filled 2023-01-10: qty 1

## 2023-01-10 MED ORDER — MORPHINE SULFATE (PF) 4 MG/ML IV SOLN
4.0000 mg | Freq: Once | INTRAVENOUS | Status: AC
  Administered 2023-01-10: 4 mg via INTRAVENOUS
  Filled 2023-01-10: qty 1

## 2023-01-10 MED ORDER — LIDOCAINE VISCOUS HCL 2 % MT SOLN
15.0000 mL | Freq: Once | OROMUCOSAL | Status: AC
  Administered 2023-01-10: 15 mL via ORAL
  Filled 2023-01-10: qty 15

## 2023-01-10 MED ORDER — SODIUM CHLORIDE 0.9 % IV BOLUS
1000.0000 mL | Freq: Once | INTRAVENOUS | Status: AC
  Administered 2023-01-10: 1000 mL via INTRAVENOUS

## 2023-01-10 MED ORDER — ONDANSETRON HCL 4 MG/2ML IJ SOLN
4.0000 mg | Freq: Once | INTRAMUSCULAR | Status: AC
  Administered 2023-01-10: 4 mg via INTRAVENOUS
  Filled 2023-01-10: qty 2

## 2023-01-10 MED ORDER — ALUM & MAG HYDROXIDE-SIMETH 200-200-20 MG/5ML PO SUSP
30.0000 mL | Freq: Once | ORAL | Status: AC
  Administered 2023-01-10: 30 mL via ORAL
  Filled 2023-01-10: qty 30

## 2023-01-10 NOTE — ED Notes (Signed)
Unable to establish IV access. Will ask another provider.

## 2023-01-10 NOTE — ED Provider Notes (Signed)
[place] EMERGENCY DEPARTMENT AT [facility] Provider Note   CSN: [id number] Arrival date & time: 01/10/23  1710     History Chief Complaint  Patient presents with   Abdominal Pain    HPI Linda Shelton is a 37 y.o. female presenting for abdominal pain. Chronic in nature. CT done at [facility]. Abdominal pain and diarrhea.  History of chronic abdominal pain that follows with general surgery here.  Status post cholecystectomy.  History of intussusception as well. Patient's recorded medical, surgical, social, medication list and allergies were reviewed in the Snapshot window as part of the initial history.   Review of Systems   Review of Systems  Constitutional:  Negative for chills and fever.  HENT:  Negative for ear pain and sore throat.   Eyes:  Negative for pain and visual disturbance.  Respiratory:  Negative for cough and shortness of breath.   Cardiovascular:  Negative for chest pain and palpitations.  Gastrointestinal:  Positive for abdominal pain and diarrhea. Negative for vomiting.  Genitourinary:  Negative for dysuria and hematuria.  Musculoskeletal:  Negative for arthralgias and back pain.  Skin:  Negative for color change and rash.  Neurological:  Negative for seizures and syncope.  All other systems reviewed and are negative.   Physical Exam Updated Vital Signs BP 113/70   Pulse 77   Temp 98.6 F (37 C)   Resp 16   Ht 5\' 3"  (1.6 m)   Wt 81.6 kg   SpO2 99%   BMI 31.89 kg/m  Physical Exam Vitals and nursing note reviewed.  Constitutional:      General: She is not in acute distress.    Appearance: She is well-developed.  HENT:     Head: Normocephalic and atraumatic.  Eyes:     Conjunctiva/sclera: Conjunctivae normal.  Cardiovascular:     Rate and Rhythm: Normal rate and regular rhythm.     Heart sounds: No murmur heard. Pulmonary:     Effort: Pulmonary effort is normal. No respiratory distress.     Breath sounds: Normal breath sounds.   Abdominal:     General: There is no distension.     Palpations: Abdomen is soft.     Tenderness: There is no abdominal tenderness. There is no right CVA tenderness or left CVA tenderness.  Musculoskeletal:        General: No swelling or tenderness. Normal range of motion.     Cervical back: Neck supple.  Skin:    General: Skin is warm and dry.  Neurological:     General: No focal deficit present.     Mental Status: She is alert and oriented to person, place, and time. Mental status is at baseline.     Cranial Nerves: No cranial nerve deficit.      ED Course/ Medical Decision Making/ A&P    Procedures Procedures   Medications Ordered in ED Medications  morphine (PF) 4 MG/ML injection 4 mg (4 mg Intravenous Given 01/10/23 2006)  ondansetron (ZOFRAN) injection 4 mg (4 mg Intravenous Given 01/10/23 2006)  sodium chloride 0.9 % bolus 1,000 mL (0 mLs Intravenous Stopped 01/10/23 2154)  famotidine (PEPCID) tablet 20 mg (20 mg Oral Given 01/10/23 2100)  alum & mag hydroxide-simeth (MAALOX/MYLANTA) 200-200-20 MG/5ML suspension 30 mL (30 mLs Oral Given 01/10/23 2100)    And  lidocaine (XYLOCAINE) 2 % viscous mouth solution 15 mL (15 mLs Oral Given 01/10/23 2059)  morphine (PF) 4 MG/ML injection 4 mg (4 mg Intravenous Given 01/10/23 2148)  Medical Decision Making:   Linda Shelton is a 37 y.o. female who presented to the ED today with abdominal pain, detailed above.    Patient placed on continuous vitals and telemetry monitoring while in ED which was reviewed periodically.  Complete initial physical exam performed, notably the patient  was HDS in NAD.    Reviewed and confirmed nursing documentation for past medical history, family history, social history.    Initial Assessment:   With the patient's presentation of abdominal pain, most likely diagnosis is nonspecific etiology. Other diagnoses were considered including (but not limited to) gastroenteritis, colitis, small bowel  obstruction, appendicitis, cholecystitis, pancreatitis, nephrolithiasis, UTI, pyleonephritis. These are considered less likely due to history of present illness and physical exam findings.   This is most consistent with an acute life/limb threatening illness complicated by underlying chronic conditions.   Initial Plan:  CBC/CMP to evaluate for underlying infectious/metabolic etiology for patient's abdominal pain  Lipase to evaluate for pancreatitis  EKG to evaluate for cardiac source of pain  Urinalysis and repeat physical assessment to evaluate for UTI/Pyelonpehritis  Empiric management of symptoms with escalating pain control and antiemetics as needed.   Initial Study Results:   Laboratory  All laboratory results reviewed without evidence of clinically relevant pathology.    Reassessment and Plan:   History of present illness physicals and findings most consistent with acute on chronic abdominal pain. No focal pathology detectable on lab work. She follows closely with [facility] surgery, will consult to see if they have any further recommendations.  Unfortunately her CT performed 4 hours ago was at an outside facility and has not been read.  We attempted to reach the radiology department but after 4 hours in the emergency department we still do not have a final read on this imaging. Patient states this feels just like her prior episodes of intussusception.  Will consult general surgery to review outside films and provide further recommendations. She is well-appearing with subacute results on initial screening labs   Final reassessment: General surgery reviewed films I do not see any focal pathology either.  Patient stable for outpatient follow-up with primary care team and gastroenterology.  No acute distress on serial reassessments hemodynamically stable.  Disposition:  I have considered need for hospitalization, however, considering all of the above, I believe this patient is  stable for discharge at this time.  Patient/family educated about specific return precautions for given chief complaint and symptoms.  Patient/family educated about follow-up with PCP.     Patient/family expressed understanding of return precautions and need for follow-up. Patient spoken to regarding all imaging and laboratory results and appropriate follow up for these results. All education provided in verbal form with additional information in written form. Time was allowed for answering of patient questions. Patient discharged.    Emergency Department Medication Summary:   Medications  morphine (PF) 4 MG/ML injection 4 mg (4 mg Intravenous Given 01/10/23 2006)  ondansetron (ZOFRAN) injection 4 mg (4 mg Intravenous Given 01/10/23 2006)  sodium chloride 0.9 % bolus 1,000 mL (0 mLs Intravenous Stopped 01/10/23 2154)  famotidine (PEPCID) tablet 20 mg (20 mg Oral Given 01/10/23 2100)  alum & mag hydroxide-simeth (MAALOX/MYLANTA) 200-200-20 MG/5ML suspension 30 mL (30 mLs Oral Given 01/10/23 2100)    And  lidocaine (XYLOCAINE) 2 % viscous mouth solution 15 mL (15 mLs Oral Given 01/10/23 2059)  morphine (PF) 4 MG/ML injection 4 mg (4 mg Intravenous Given 01/10/23 2148)     Clinical Impression:  1.  Abdominal pain, unspecified abdominal location      Discharge   Final Clinical Impression(s) / ED Diagnoses Final diagnoses:  Abdominal pain, unspecified abdominal location    Rx / DC Orders ED Discharge Orders     None         Glyn Ade, MD 01/10/23 2348

## 2023-01-10 NOTE — ED Triage Notes (Signed)
Patient has had right abdominal pain for the last hour. Had a CT scan today but did not get the results yet. No vomiting but is having diarrhea. History of intussusception.

## 2023-01-31 ENCOUNTER — Ambulatory Visit: Payer: Self-pay | Admitting: General Surgery

## 2023-01-31 MED ORDER — DEXTROSE 5 % IV SOLN: 900.0000 mg | INTRAVENOUS | Status: AC

## 2023-01-31 MED ORDER — GENTAMICIN SULFATE 40 MG/ML IJ SOLN: 5.0000 mg/kg | INTRAVENOUS | Status: AC

## 2023-03-08 ENCOUNTER — Encounter (HOSPITAL_COMMUNITY)
Admission: RE | Admit: 2023-03-08 | Discharge: 2023-03-08 | Disposition: A | Discharge status: Home / Self Care | Payer: Medicaid Other | Source: Ambulatory Visit | Attending: General Surgery | Admitting: General Surgery

## 2023-03-08 ENCOUNTER — Other Ambulatory Visit: Payer: Self-pay

## 2023-03-08 ENCOUNTER — Encounter (HOSPITAL_COMMUNITY): Payer: Self-pay | Admitting: *Deleted

## 2023-03-08 VITALS — BP 114/78 | HR 71 | Temp 98.2°F | Ht 63.0 in | Wt 179.0 lb

## 2023-03-08 DIAGNOSIS — Z01812 Encounter for preprocedural laboratory examination: Secondary | ICD-10-CM | POA: Diagnosis present

## 2023-03-08 DIAGNOSIS — Z01818 Encounter for other preprocedural examination: Secondary | ICD-10-CM

## 2023-03-08 LAB — CBC
HCT: 30.1 % — ABNORMAL LOW (ref 36.0–46.0)
Hemoglobin: 9.6 g/dL — ABNORMAL LOW (ref 12.0–15.0)
MCH: 24.4 pg — ABNORMAL LOW (ref 26.0–34.0)
MCHC: 31.9 g/dL (ref 30.0–36.0)
MCV: 76.4 fL — ABNORMAL LOW (ref 80.0–100.0)
Platelets: 365 10*3/uL (ref 150–400)
RBC: 3.94 MIL/uL (ref 3.87–5.11)
RDW: 16.6 % — ABNORMAL HIGH (ref 11.5–15.5)
WBC: 4.3 10*3/uL (ref 4.0–10.5)
nRBC: 0 % (ref 0.0–0.2)

## 2023-03-08 LAB — BASIC METABOLIC PANEL
Anion gap: 6 (ref 5–15)
BUN: 13 mg/dL (ref 6–20)
CO2: 27 mmol/L (ref 22–32)
Calcium: 9.3 mg/dL (ref 8.9–10.3)
Chloride: 105 mmol/L (ref 98–111)
Creatinine, Ser: 0.68 mg/dL (ref 0.44–1.00)
GFR, Estimated: >60 mL/min (ref >60)
Glucose, Bld: 76 mg/dL (ref 70–99)
Potassium: 3.7 mmol/L (ref 3.5–5.1)
Sodium: 138 mmol/L (ref 135–145)

## 2023-03-08 NOTE — Progress Notes (Addendum)
For Anesthesia: PCP - Dr. Laqueta Due. Cardiologist - N/A  Bowel Prep reminder:  Chest x-ray - 07/31/22 EKG - 01/11/23 Stress Test -  ECHO - 04/20/16: [facility] Cardiac Cath -  Pacemaker/ICD device last checked: Pacemaker orders received: Device Rep notified:  Spinal Cord Stimulator:  Sleep Study - N/A CPAP -   Fasting Blood Sugar - N/A Checks Blood Sugar _____ times a day Date and result of last Hgb A1c-  Last dose of GLP1 agonist- N/A GLP1 instructions:   Last dose of SGLT-2 inhibitors- N/A SGLT-2 instructions:   Blood Thinner Instructions:N/A Aspirin Instructions: Last Dose:  Activity level: Can go up a flight of stairs and activities of daily living without stopping and without chest pain and/or shortness of breath   Able to exercise without chest pain and/or shortness of breath  Anesthesia review:   Patient denies shortness of breath, fever, cough and chest pain at PAT appointment   Patient verbalized understanding of instructions that were given to them at the PAT appointment. Patient was also instructed that they will need to review over the PAT instructions again at home before surgery.

## 2023-03-17 ENCOUNTER — Encounter (HOSPITAL_COMMUNITY): Payer: Self-pay | Admitting: General Surgery

## 2023-03-17 NOTE — Anesthesia Preprocedure Evaluation (Addendum)
 Anesthesia Evaluation  Patient identified by MRN, date of birth, ID band Patient awake    Reviewed: Allergy & Precautions, NPO status , Patient's Chart, lab work & pertinent test results  Airway Mallampati: II  TM Distance: >3 FB     Dental  (+) Dental Advisory Given, Teeth Intact   Pulmonary neg pulmonary ROS   Pulmonary exam normal breath sounds clear to auscultation       Cardiovascular negative cardio ROS Normal cardiovascular exam Rhythm:Regular Rate:Normal     Neuro/Psych negative neurological ROS  negative psych ROS   GI/Hepatic Neg liver ROS,,,Abdominal pain Intussusception Hx/o Gastric bypass    Endo/Other  Obesity  Renal/GU negative Renal ROS  negative genitourinary   Musculoskeletal  (+) Arthritis , Rheumatoid disorders,    Abdominal  (+) + obese  Peds  Hematology  (+) Blood dyscrasia, anemia   Anesthesia Other Findings   Reproductive/Obstetrics                              Anesthesia Physical Anesthesia Plan  ASA: 2  Anesthesia Plan: General   Post-op Pain Management: Dilaudid IV, Ofirmev IV (intra-op)* and Precedex   Induction: Intravenous and Cricoid pressure planned  PONV Risk Score and Plan: 4 or greater and Treatment may vary due to age or medical condition, Scopolamine patch - Pre-op, Midazolam, Dexamethasone and Ondansetron  Airway Management Planned: Oral ETT  Additional Equipment: None  Intra-op Plan:   Post-operative Plan: Extubation in OR  Informed Consent: I have reviewed the patients History and Physical, chart, labs and discussed the procedure including the risks, benefits and alternatives for the proposed anesthesia with the patient or authorized representative who has indicated his/her understanding and acceptance.     Dental advisory given  Plan Discussed with: CRNA and Anesthesiologist  Anesthesia Plan Comments:          Anesthesia Quick Evaluation

## 2023-03-20 ENCOUNTER — Other Ambulatory Visit: Payer: Self-pay

## 2023-03-20 ENCOUNTER — Encounter (HOSPITAL_COMMUNITY): Payer: Self-pay | Admitting: General Surgery

## 2023-03-20 ENCOUNTER — Ambulatory Visit (HOSPITAL_COMMUNITY): Payer: Medicaid Other | Admitting: Anesthesiology

## 2023-03-20 ENCOUNTER — Observation Stay (HOSPITAL_COMMUNITY)
Admission: AD | Admit: 2023-03-20 | Discharge: 2023-03-22 | DRG: 330 | Disposition: A | Discharge status: Home / Self Care | Payer: Medicaid Other | Attending: General Surgery | Admitting: General Surgery

## 2023-03-20 ENCOUNTER — Encounter (HOSPITAL_COMMUNITY)
Admission: AD | Disposition: A | Discharge status: Home / Self Care | Payer: Self-pay | Source: Home / Self Care | Attending: General Surgery

## 2023-03-20 DIAGNOSIS — D649 Anemia, unspecified: Secondary | ICD-10-CM | POA: Diagnosis not present

## 2023-03-20 DIAGNOSIS — K458 Other specified abdominal hernia without obstruction or gangrene: Secondary | ICD-10-CM | POA: Diagnosis present

## 2023-03-20 DIAGNOSIS — Z8719 Personal history of other diseases of the digestive system: Secondary | ICD-10-CM | POA: Insufficient documentation

## 2023-03-20 DIAGNOSIS — Z9049 Acquired absence of other specified parts of digestive tract: Principal | ICD-10-CM

## 2023-03-20 DIAGNOSIS — Z9884 Bariatric surgery status: Secondary | ICD-10-CM

## 2023-03-20 DIAGNOSIS — Z6831 Body mass index (BMI) 31.0-31.9, adult: Secondary | ICD-10-CM | POA: Diagnosis not present

## 2023-03-20 DIAGNOSIS — G8929 Other chronic pain: Secondary | ICD-10-CM | POA: Diagnosis present

## 2023-03-20 DIAGNOSIS — R109 Unspecified abdominal pain: Secondary | ICD-10-CM | POA: Insufficient documentation

## 2023-03-20 DIAGNOSIS — K909 Intestinal malabsorption, unspecified: Secondary | ICD-10-CM | POA: Diagnosis present

## 2023-03-20 DIAGNOSIS — Z880 Allergy status to penicillin: Secondary | ICD-10-CM

## 2023-03-20 DIAGNOSIS — K660 Peritoneal adhesions (postprocedural) (postinfection): Secondary | ICD-10-CM | POA: Diagnosis present

## 2023-03-20 DIAGNOSIS — Z01818 Encounter for other preprocedural examination: Secondary | ICD-10-CM

## 2023-03-20 DIAGNOSIS — D500 Iron deficiency anemia secondary to blood loss (chronic): Secondary | ICD-10-CM | POA: Diagnosis not present

## 2023-03-20 DIAGNOSIS — E669 Obesity, unspecified: Secondary | ICD-10-CM | POA: Insufficient documentation

## 2023-03-20 DIAGNOSIS — K5939 Other megacolon: Secondary | ICD-10-CM | POA: Diagnosis not present

## 2023-03-20 DIAGNOSIS — M069 Rheumatoid arthritis, unspecified: Secondary | ICD-10-CM | POA: Diagnosis present

## 2023-03-20 HISTORY — PX: LAPAROSCOPIC REVISION OF GASTROJEJUNOSTOMY: SHX5922

## 2023-03-20 LAB — POCT PREGNANCY, URINE: Preg Test, Ur: NEGATIVE

## 2023-03-20 SURGERY — REVISION, ANASTOMOSIS, GASTROJEJUNAL, LAPAROSCOPIC
Anesthesia: General

## 2023-03-20 MED ORDER — HYDROMORPHONE HCL 1 MG/ML IJ SOLN
INTRAMUSCULAR | Status: DC | PRN
  Administered 2023-03-20: .2 mg via INTRAVENOUS
  Administered 2023-03-20 (×2): .4 mg via INTRAVENOUS
  Administered 2023-03-20: .2 mg via INTRAVENOUS

## 2023-03-20 MED ORDER — SUGAMMADEX SODIUM 200 MG/2ML IV SOLN
INTRAVENOUS | Status: DC | PRN
  Administered 2023-03-20: 200 mg via INTRAVENOUS

## 2023-03-20 MED ORDER — SODIUM CHLORIDE 0.9 % IV SOLN
2.0000 g | Freq: Once | INTRAVENOUS | Status: AC
  Administered 2023-03-20: 2 g via INTRAVENOUS
  Filled 2023-03-20: qty 2

## 2023-03-20 MED ORDER — MIDAZOLAM HCL 2 MG/2ML IJ SOLN
INTRAMUSCULAR | Status: AC
  Filled 2023-03-20: qty 2

## 2023-03-20 MED ORDER — DEXAMETHASONE SODIUM PHOSPHATE 10 MG/ML IJ SOLN
INTRAMUSCULAR | Status: AC
  Filled 2023-03-20: qty 1

## 2023-03-20 MED ORDER — PROPOFOL 10 MG/ML IV BOLUS
INTRAVENOUS | Status: AC
  Filled 2023-03-20: qty 20

## 2023-03-20 MED ORDER — ONDANSETRON HCL 4 MG/2ML IJ SOLN: 4.0000 mg | Freq: Once | INTRAMUSCULAR | Status: DC | PRN

## 2023-03-20 MED ORDER — SIMETHICONE 80 MG PO CHEW
80.0000 mg | CHEWABLE_TABLET | Freq: Four times a day (QID) | ORAL | Status: DC | PRN
  Administered 2023-03-20 (×2): 80 mg via ORAL
  Filled 2023-03-20 (×2): qty 1

## 2023-03-20 MED ORDER — GABAPENTIN 100 MG PO CAPS
100.0000 mg | ORAL_CAPSULE | Freq: Two times a day (BID) | ORAL | Status: DC
  Administered 2023-03-20 – 2023-03-22 (×4): 100 mg via ORAL
  Filled 2023-03-20 (×4): qty 1

## 2023-03-20 MED ORDER — KETOROLAC TROMETHAMINE 30 MG/ML IJ SOLN
INTRAMUSCULAR | Status: DC | PRN
  Administered 2023-03-20: 15 mg via INTRAVENOUS

## 2023-03-20 MED ORDER — FLUTICASONE PROPIONATE 50 MCG/ACT NA SUSP: 1.0000 | Freq: Every day | NASAL | Status: DC | PRN

## 2023-03-20 MED ORDER — BUPIVACAINE-EPINEPHRINE 0.25% -1:200000 IJ SOLN
INTRAMUSCULAR | Status: AC
  Filled 2023-03-20: qty 1

## 2023-03-20 MED ORDER — OXYCODONE HCL 5 MG PO TABS: 5.0000 mg | ORAL_TABLET | Freq: Once | ORAL | Status: DC | PRN

## 2023-03-20 MED ORDER — ENSURE MAX PROTEIN PO LIQD
2.0000 [oz_av] | ORAL | Status: DC
  Administered 2023-03-21 – 2023-03-22 (×2): 2 [oz_av] via ORAL

## 2023-03-20 MED ORDER — ONDANSETRON HCL 4 MG/2ML IJ SOLN
INTRAMUSCULAR | Status: AC
  Filled 2023-03-20: qty 2

## 2023-03-20 MED ORDER — ORAL CARE MOUTH RINSE: 15.0000 mL | Freq: Once | OROMUCOSAL | Status: AC

## 2023-03-20 MED ORDER — SUCCINYLCHOLINE CHLORIDE 200 MG/10ML IV SOSY
PREFILLED_SYRINGE | INTRAVENOUS | Status: DC | PRN
  Administered 2023-03-20: 100 mg via INTRAVENOUS

## 2023-03-20 MED ORDER — HYDROMORPHONE HCL 1 MG/ML IJ SOLN
0.2500 mg | INTRAMUSCULAR | Status: DC | PRN
  Administered 2023-03-20 (×4): 0.5 mg via INTRAVENOUS

## 2023-03-20 MED ORDER — CARMEX CLASSIC LIP BALM EX OINT
TOPICAL_OINTMENT | CUTANEOUS | Status: DC | PRN
  Filled 2023-03-20: qty 10

## 2023-03-20 MED ORDER — LACTATED RINGERS IV SOLN: INTRAVENOUS | Status: DC

## 2023-03-20 MED ORDER — ROCURONIUM BROMIDE 10 MG/ML (PF) SYRINGE
PREFILLED_SYRINGE | INTRAVENOUS | Status: AC
Start: 2023-03-20 — End: ?
  Filled 2023-03-20: qty 10

## 2023-03-20 MED ORDER — SODIUM CHLORIDE 0.9 % IV SOLN
12.5000 mg | Freq: Four times a day (QID) | INTRAVENOUS | Status: DC | PRN
Start: 2023-03-20 — End: 2023-03-23

## 2023-03-20 MED ORDER — ACETAMINOPHEN 500 MG PO TABS
1000.0000 mg | ORAL_TABLET | Freq: Three times a day (TID) | ORAL | Status: DC
  Administered 2023-03-20 – 2023-03-22 (×5): 1000 mg via ORAL
  Filled 2023-03-20 (×6): qty 2

## 2023-03-20 MED ORDER — LACTATED RINGERS IR SOLN
Status: DC | PRN
  Administered 2023-03-20: 1000 mL

## 2023-03-20 MED ORDER — PANTOPRAZOLE SODIUM 40 MG IV SOLR
40.0000 mg | Freq: Every day | INTRAVENOUS | Status: DC
  Administered 2023-03-20 – 2023-03-21 (×2): 40 mg via INTRAVENOUS
  Filled 2023-03-20 (×2): qty 10

## 2023-03-20 MED ORDER — BUPIVACAINE-EPINEPHRINE 0.25% -1:200000 IJ SOLN
INTRAMUSCULAR | Status: DC | PRN
  Administered 2023-03-20: 60 mL

## 2023-03-20 MED ORDER — HEPARIN SODIUM (PORCINE) 5000 UNIT/ML IJ SOLN
5000.0000 [IU] | Freq: Three times a day (TID) | INTRAMUSCULAR | Status: DC
  Administered 2023-03-21 – 2023-03-22 (×5): 5000 [IU] via SUBCUTANEOUS
  Filled 2023-03-20 (×5): qty 1

## 2023-03-20 MED ORDER — HYDROMORPHONE HCL 2 MG/ML IJ SOLN
INTRAMUSCULAR | Status: AC
Start: 2023-03-20 — End: ?
  Filled 2023-03-20: qty 1

## 2023-03-20 MED ORDER — CHLORHEXIDINE GLUCONATE CLOTH 2 % EX PADS: 6.0000 | MEDICATED_PAD | Freq: Once | CUTANEOUS | Status: DC

## 2023-03-20 MED ORDER — FENTANYL CITRATE (PF) 250 MCG/5ML IJ SOLN
INTRAMUSCULAR | Status: DC | PRN
  Administered 2023-03-20: 100 ug via INTRAVENOUS

## 2023-03-20 MED ORDER — ACETAMINOPHEN 500 MG PO TABS
1000.0000 mg | ORAL_TABLET | ORAL | Status: AC
  Administered 2023-03-20: 1000 mg via ORAL
  Filled 2023-03-20: qty 2

## 2023-03-20 MED ORDER — DEXAMETHASONE SODIUM PHOSPHATE 4 MG/ML IJ SOLN: 4.0000 mg | INTRAMUSCULAR | Status: DC

## 2023-03-20 MED ORDER — 0.9 % SODIUM CHLORIDE (POUR BTL) OPTIME
TOPICAL | Status: DC | PRN
Start: 2023-03-20 — End: 2023-03-20
  Administered 2023-03-20: 1000 mL

## 2023-03-20 MED ORDER — ONDANSETRON HCL 4 MG/2ML IJ SOLN
INTRAMUSCULAR | Status: DC | PRN
  Administered 2023-03-20: 4 mg via INTRAVENOUS

## 2023-03-20 MED ORDER — HYDROMORPHONE HCL 1 MG/ML IJ SOLN
0.5000 mg | INTRAMUSCULAR | Status: DC | PRN
  Administered 2023-03-20: 0.5 mg via INTRAVENOUS

## 2023-03-20 MED ORDER — HYDROMORPHONE HCL 1 MG/ML IJ SOLN
INTRAMUSCULAR | Status: AC
  Filled 2023-03-20: qty 1

## 2023-03-20 MED ORDER — SCOPOLAMINE 1 MG/3DAYS TD PT72
1.0000 | MEDICATED_PATCH | TRANSDERMAL | Status: DC
  Administered 2023-03-20: 1.5 mg via TRANSDERMAL
  Filled 2023-03-20: qty 1

## 2023-03-20 MED ORDER — ACETAMINOPHEN 160 MG/5ML PO SOLN: 1000.0000 mg | Freq: Three times a day (TID) | ORAL | Status: DC

## 2023-03-20 MED ORDER — PHENYLEPHRINE 80 MCG/ML (10ML) SYRINGE FOR IV PUSH (FOR BLOOD PRESSURE SUPPORT)
PREFILLED_SYRINGE | INTRAVENOUS | Status: DC | PRN
  Administered 2023-03-20: 80 ug via INTRAVENOUS

## 2023-03-20 MED ORDER — BUPIVACAINE LIPOSOME 1.3 % IJ SUSP
INTRAMUSCULAR | Status: AC
  Filled 2023-03-20: qty 20

## 2023-03-20 MED ORDER — ONDANSETRON HCL 4 MG/2ML IJ SOLN: 4.0000 mg | Freq: Four times a day (QID) | INTRAMUSCULAR | Status: DC | PRN

## 2023-03-20 MED ORDER — MIDAZOLAM HCL 2 MG/2ML IJ SOLN
INTRAMUSCULAR | Status: DC | PRN
  Administered 2023-03-20: 2 mg via INTRAVENOUS

## 2023-03-20 MED ORDER — KCL IN DEXTROSE-NACL 20-5-0.45 MEQ/L-%-% IV SOLN
INTRAVENOUS | Status: AC
  Filled 2023-03-20 (×3): qty 1000

## 2023-03-20 MED ORDER — OXYCODONE HCL 5 MG/5ML PO SOLN: 5.0000 mg | Freq: Once | ORAL | Status: DC | PRN

## 2023-03-20 MED ORDER — VISTASEAL 4 ML SINGLE DOSE KIT
4.0000 mL | PACK | Freq: Once | CUTANEOUS | Status: AC
  Administered 2023-03-20: 4 mL via TOPICAL
  Filled 2023-03-20: qty 4

## 2023-03-20 MED ORDER — FENTANYL CITRATE (PF) 100 MCG/2ML IJ SOLN
INTRAMUSCULAR | Status: AC
  Filled 2023-03-20: qty 2

## 2023-03-20 MED ORDER — OXYCODONE HCL 5 MG/5ML PO SOLN
5.0000 mg | Freq: Four times a day (QID) | ORAL | Status: DC | PRN
  Administered 2023-03-20 – 2023-03-21 (×3): 5 mg via ORAL
  Filled 2023-03-20 (×3): qty 5

## 2023-03-20 MED ORDER — HYDROMORPHONE HCL 1 MG/ML IJ SOLN
INTRAMUSCULAR | Status: AC
  Filled 2023-03-20: qty 2

## 2023-03-20 MED ORDER — MORPHINE SULFATE (PF) 2 MG/ML IV SOLN
1.0000 mg | INTRAVENOUS | Status: DC | PRN
  Administered 2023-03-20 – 2023-03-22 (×12): 2 mg via INTRAVENOUS
  Filled 2023-03-20 (×12): qty 1

## 2023-03-20 MED ORDER — CHLORHEXIDINE GLUCONATE 0.12 % MT SOLN
15.0000 mL | Freq: Once | OROMUCOSAL | Status: AC
  Administered 2023-03-20: 15 mL via OROMUCOSAL

## 2023-03-20 MED ORDER — PROPOFOL 10 MG/ML IV BOLUS
INTRAVENOUS | Status: DC | PRN
Start: 2023-03-20 — End: 2023-03-20
  Administered 2023-03-20: 150 mg via INTRAVENOUS
  Administered 2023-03-20: 80 ug/kg/min via INTRAVENOUS

## 2023-03-20 MED ORDER — LIDOCAINE HCL (CARDIAC) PF 100 MG/5ML IV SOSY
PREFILLED_SYRINGE | INTRAVENOUS | Status: DC | PRN
  Administered 2023-03-20: 60 mg via INTRATRACHEAL

## 2023-03-20 MED ORDER — ROCURONIUM BROMIDE 10 MG/ML (PF) SYRINGE
PREFILLED_SYRINGE | INTRAVENOUS | Status: DC | PRN
  Administered 2023-03-20: 40 mg via INTRAVENOUS
  Administered 2023-03-20 (×3): 10 mg via INTRAVENOUS

## 2023-03-20 MED ORDER — DEXAMETHASONE SODIUM PHOSPHATE 10 MG/ML IJ SOLN
INTRAMUSCULAR | Status: DC | PRN
  Administered 2023-03-20: 4 mg via INTRAVENOUS

## 2023-03-20 SURGICAL SUPPLY — 80 items
ANTIFOG SOL W/FOAM PAD STRL (MISCELLANEOUS) ×1 IMPLANT
APPLICATOR COTTON TIP 6 STRL (MISCELLANEOUS) IMPLANT
APPLICATOR COTTON TIP 6IN STRL (MISCELLANEOUS) IMPLANT
APPLICATOR VISTASEAL 35 (MISCELLANEOUS) IMPLANT
APPLIER CLIP ROT 10 11.4 M/L (STAPLE) IMPLANT
APPLIER CLIP ROT 13.4 12 LRG (CLIP) IMPLANT
BAG COUNTER SPONGE SURGICOUNT (BAG) IMPLANT
BLADE SURG SZ11 CARB STEEL (BLADE) ×1 IMPLANT
CABLE HIGH FREQUENCY MONO STRZ (ELECTRODE) IMPLANT
CHLORAPREP W/TINT 26 (MISCELLANEOUS) ×2 IMPLANT
CLIP APPLIE ROT 10 11.4 M/L (STAPLE) IMPLANT
CLIP APPLIE ROT 13.4 12 LRG (CLIP) IMPLANT
CLIP SUT LAPRA TY ABSORB (SUTURE) IMPLANT
COVER SURGICAL LIGHT HANDLE (MISCELLANEOUS) ×1 IMPLANT
DERMABOND ADVANCED .7 DNX12 (GAUZE/BANDAGES/DRESSINGS) IMPLANT
DEVICE SUT QUICK LOAD TK 5 (SUTURE) IMPLANT
DEVICE SUT TI-KNOT TK 5X26 (SUTURE) IMPLANT
DEVICE SUTURE ENDOST 10MM (ENDOMECHANICALS) IMPLANT
DISSECTOR BLUNT TIP ENDO 5MM (MISCELLANEOUS) IMPLANT
DRAPE UTILITY XL STRL (DRAPES) ×2 IMPLANT
DRSG TEGADERM 2-3/8X2-3/4 SM (GAUZE/BANDAGES/DRESSINGS) ×6 IMPLANT
ELECT L-HOOK LAP 45CM DISP (ELECTROSURGICAL) IMPLANT
ELECT REM PT RETURN 15FT ADLT (MISCELLANEOUS) ×1 IMPLANT
ELECTRODE L-HOOK LAP 45CM DISP (ELECTROSURGICAL) IMPLANT
GAUZE SPONGE 2X2 8PLY STRL LF (GAUZE/BANDAGES/DRESSINGS) IMPLANT
GAUZE SPONGE 4X4 12PLY STRL (GAUZE/BANDAGES/DRESSINGS) IMPLANT
GLOVE BIO SURGEON STRL SZ7.5 (GLOVE) ×1 IMPLANT
GLOVE INDICATOR 8.0 STRL GRN (GLOVE) ×1 IMPLANT
GOWN STRL REUS W/ TWL XL LVL3 (GOWN DISPOSABLE) ×3 IMPLANT
GRASPER SUT TROCAR 14GX15 (MISCELLANEOUS) ×1 IMPLANT
IRRIG SUCT STRYKERFLOW 2 WTIP (MISCELLANEOUS) ×1 IMPLANT
IRRIGATION SUCT STRKRFLW 2 WTP (MISCELLANEOUS) ×1 IMPLANT
KIT BASIN OR (CUSTOM PROCEDURE TRAY) ×1 IMPLANT
KIT TURNOVER KIT A (KITS) IMPLANT
MARKER SKIN DUAL TIP RULER LAB (MISCELLANEOUS) ×1 IMPLANT
MAT PREVALON FULL STRYKER (MISCELLANEOUS) ×1 IMPLANT
NDL SPNL 22GX3.5 QUINCKE BK (NEEDLE) ×1 IMPLANT
NEEDLE SPNL 22GX3.5 QUINCKE BK (NEEDLE) ×1 IMPLANT
PACK UNIVERSAL I (CUSTOM PROCEDURE TRAY) ×1 IMPLANT
POUCH RETRIEVAL ECOSAC 10 (ENDOMECHANICALS) IMPLANT
RELOAD STAPLE 60 2.6 WHT THN (STAPLE) IMPLANT
RELOAD STAPLE 60 3.6 BLU REG (STAPLE) ×1 IMPLANT
RELOAD STAPLE 60 3.8 GOLD REG (STAPLE) IMPLANT
RELOAD STAPLE 60 4.1 GRN THCK (STAPLE) ×1 IMPLANT
RELOAD STAPLE 60 BLK VRY/THCK (STAPLE) IMPLANT
RELOAD SUT SNGL STCH ABSRB 2-0 (ENDOMECHANICALS) IMPLANT
RELOAD SUT SNGL STCH BLK 2-0 (ENDOMECHANICALS) IMPLANT
SCISSORS LAP 5X45 EPIX DISP (ENDOMECHANICALS) IMPLANT
SEALANT SURGICAL APPL DUAL CAN (MISCELLANEOUS) IMPLANT
SET TUBE SMOKE EVAC HIGH FLOW (TUBING) ×1 IMPLANT
SHEARS HARMONIC 45 ACE (MISCELLANEOUS) ×1 IMPLANT
SLEEVE ADV FIXATION 5X100MM (TROCAR) ×2 IMPLANT
SLEEVE GASTRECTOMY 40FR VISIGI (MISCELLANEOUS) ×1 IMPLANT
SOLUTION ANTFG W/FOAM PAD STRL (MISCELLANEOUS) ×1 IMPLANT
SPIKE FLUID TRANSFER (MISCELLANEOUS) ×1 IMPLANT
STAPLE LINE REINFORCEMENT LAP (STAPLE) IMPLANT
STAPLER ECHELON BIOABSB 60 FLE (MISCELLANEOUS) IMPLANT
STAPLER ECHELON LONG 3000 60 (ENDOMECHANICALS) IMPLANT
STAPLER ECHELON LONG 60 440 (INSTRUMENTS) IMPLANT
STAPLER RELOAD 60MM BLK (STAPLE) IMPLANT
STAPLER RELOAD BLUE 60MM (STAPLE) ×1 IMPLANT
STAPLER RELOAD GOLD 60MM (STAPLE) IMPLANT
STAPLER RELOAD GREEN 60MM (STAPLE) ×1 IMPLANT
STAPLER RELOAD WHITE 60MM (STAPLE) ×5 IMPLANT
STRIP CLOSURE SKIN 1/2X4 (GAUZE/BANDAGES/DRESSINGS) ×1 IMPLANT
SUT MNCRL AB 4-0 PS2 18 (SUTURE) ×1 IMPLANT
SUT RELOAD ENDO STITCH 2 48X1 (ENDOMECHANICALS) ×6 IMPLANT
SUT RELOAD ENDO STITCH 2.0 (ENDOMECHANICALS) ×8 IMPLANT
SUT SURGIDAC NAB ES-9 0 48 120 (SUTURE) IMPLANT
SUT VICRYL 0 TIES 12 18 (SUTURE) ×1 IMPLANT
SYR 20ML LL LF (SYRINGE) ×1 IMPLANT
SYR 50ML LL SCALE MARK (SYRINGE) ×1 IMPLANT
SYS KII OPTICAL ACCESS 15MM (TROCAR) ×1 IMPLANT
SYSTEM KII OPTICAL ACCESS 15MM (TROCAR) ×1 IMPLANT
TOWEL OR 17X26 10 PK STRL BLUE (TOWEL DISPOSABLE) ×1 IMPLANT
TROCAR ADV FIXATION 5X100MM (TROCAR) ×1 IMPLANT
TROCAR XCEL NON-BLD 5MMX100MML (ENDOMECHANICALS) ×1 IMPLANT
TROCAR Z-THREAD OPTICAL 5X100M (TROCAR) IMPLANT
TUBING CONNECTING 10 (TUBING) ×2 IMPLANT
TUBING ENDO SMARTCAP (MISCELLANEOUS) ×1 IMPLANT

## 2023-03-20 NOTE — Anesthesia Procedure Notes (Signed)
 Procedure Name: Intubation Date/Time: 03/20/2023 7:48 AM  Performed by: Randa Evens, CRNAPre-anesthesia Checklist: Patient identified, Emergency Drugs available, Suction available and Patient being monitored Patient Re-evaluated:Patient Re-evaluated prior to induction Oxygen Delivery Method: Circle System Utilized Preoxygenation: Pre-oxygenation with 100% oxygen Induction Type: IV induction Ventilation: Mask ventilation without difficulty Laryngoscope Size: Mac and 3 Grade View: Grade I Tube type: Oral Tube size: 7.0 mm Number of attempts: 1 Airway Equipment and Method: Stylet and Oral airway Placement Confirmation: ETT inserted through vocal cords under direct vision, positive ETCO2 and breath sounds checked- equal and bilateral Secured at: 21 cm Tube secured with: Tape Dental Injury: Teeth and Oropharynx as per pre-operative assessment

## 2023-03-20 NOTE — Anesthesia Postprocedure Evaluation (Signed)
 Anesthesia Post Note  Patient: Linda Shelton  Procedure(s) Performed: LAPAROSCOPIC REVISION OF jejunostomy, LAPAROSCOPIC CLOSURE OF MESENTERY DEFECT     Patient location during evaluation: PACU Anesthesia Type: General Level of consciousness: awake and alert and oriented Pain management: pain level controlled Vital Signs Assessment: post-procedure vital signs reviewed and stable Respiratory status: spontaneous breathing, nonlabored ventilation and respiratory function stable Cardiovascular status: blood pressure returned to baseline and stable Postop Assessment: no apparent nausea or vomiting Anesthetic complications: no   No notable events documented.  Last Vitals:  Vitals:   03/20/23 1030 03/20/23 1045  BP: 106/74 102/63  Pulse: 83 98  Resp: 13 16  Temp:    SpO2: 99% 98%    Last Pain:  Vitals:   03/20/23 1107  TempSrc:   PainSc: 6                  Dawne Casali A.

## 2023-03-20 NOTE — H&P (Signed)
 CC: here for surgery  Requesting provider: n/a  HPI: Linda Shelton is an 38 y.o. female who is here for laparoscopic revision of jejunojejunostomy. No changes since seen in clinic. Still having intermittent abdominal pain, left and right side with bloating.  Past Medical History:  Diagnosis Date   Anemia    Hypoglycemia    Pericarditis    Pleural effusion associated with pulmonary infection    RA (rheumatoid arthritis) (HCC)     Past Surgical History:  Procedure Laterality Date   CHOLECYSTECTOMY N/A 04/28/2021   Procedure: LAPAROSCOPIC CHOLECYSTECTOMY;  Surgeon: [name], MD;  Location: [facility];  Service: General;  Laterality: N/A;   GASTRIC BYPASS     LAPAROSCOPIC APPENDECTOMY N/A 03/03/2019   Procedure: APPENDECTOMY LAPAROSCOPIC;  Surgeon: Almond Lint, MD;  Location: [facility] OR;  Service: General;  Laterality: N/A;   LAPAROSCOPIC LYSIS OF ADHESIONS N/A 04/28/2021   Procedure: LAPAROSCOPIC LYSIS OF ADHESIONS;  Surgeon: [name], MD;  Location: [facility];  Service: General;  Laterality: N/A;   LAPAROSCOPY N/A 04/28/2021   Procedure: LAPAROSCOPY DIAGNOSTIC;  Surgeon: [name], MD;  Location: [facility];  Service: General;  Laterality: N/A;   LAPAROTOMY N/A 12/12/2020   Procedure: EXPLORATORY LAPAROTOMY;  Surgeon: Axel Filler, MD;  Location: [facility];  Service: General;  Laterality: N/A;   TUBAL LIGATION      History reviewed. No pertinent family history.  Social:  reports that she has never smoked. She has never used smokeless tobacco. She reports that she does not drink alcohol and does not use drugs.  Allergies:  Allergies  Allergen Reactions   Amoxicillin-Pot Clavulanate Other (See Comments)    Stomach upset/pain    Medications: I have reviewed the patient's current medications.   ROS - all of the below systems have been reviewed with the patient and positives are indicated with bold text General: chills, fever or night sweats Eyes: blurry vision or double vision ENT:  epistaxis or sore throat Allergy/Immunology: itchy/watery eyes or nasal congestion Hematologic/Lymphatic: bleeding problems, blood clots or swollen lymph nodes Endocrine: temperature intolerance or unexpected weight changes Breast: new or changing breast lumps or nipple discharge Resp: cough, shortness of breath, or wheezing CV: chest pain or dyspnea on exertion GI: as per HPI GU: dysuria, trouble voiding, or hematuria MSK: joint pain or joint stiffness Neuro: TIA or stroke symptoms Derm: pruritus and skin lesion changes Psych: anxiety and depression  PE Blood pressure 115/76, pulse 75, temperature 98.3 F (36.8 C), temperature source Oral, resp. rate 16, height 5\' 3"  (1.6 m), weight 80.7 kg, last menstrual period 03/03/2023, SpO2 98%. Constitutional: NAD; conversant; no deformities Eyes: Moist conjunctiva; no lid lag; anicteric; PERRL Neck: Trachea midline; no thyromegaly Lungs: Normal respiratory effort; no tactile fremitus CV: RRR; no palpable thrills; no pitting edema GI: Abd old surgical scars, nd, nt; no palpable hepatosplenomegaly MSK: Normal gait; no clubbing/cyanosis Psychiatric: Appropriate affect; alert and oriented x3 Lymphatic: No palpable cervical or axillary lymphadenopathy Skin:no rash/lesions  Results for orders placed or performed during the hospital encounter of 03/20/23 (from the past 48 hours)  Pregnancy, urine POC     Status: None   Collection Time: 03/20/23  6:08 AM  Result Value Ref Range   Preg Test, Ur NEGATIVE NEGATIVE    Comment:        THE SENSITIVITY OF THIS METHODOLOGY IS >24 mIU/mL     No results found.  Imaging: reviewed  A/P: Linda Shelton is an 38 y.o. female with  Right sided abdominal  pain, unspecified  Gastric bypass status for obesity  Abdominal bloating   To OR for revision of  jejunojejunostomy Rediscussed risk/benefits Eras Iv abx  Mary Sella. Andrey Campanile, MD, FACS General, Bariatric, & Minimally Invasive Surgery [facility]

## 2023-03-20 NOTE — Transfer of Care (Signed)
 Immediate Anesthesia Transfer of Care Note  Patient: Linda Shelton  Procedure(s) Performed: LAPAROSCOPIC REVISION OF jejunostomy, LAPAROSCOPIC CLOSURE OF MESENTERY DEFECT  Patient Location: PACU  Anesthesia Type:General  Level of Consciousness: sedated  Airway & Oxygen Therapy: Patient Spontanous Breathing  Post-op Assessment: Report given to RN  Post vital signs: Reviewed and stable  Last Vitals:  Vitals Value Taken Time  BP 99/61 03/20/23 1022  Temp    Pulse 89 03/20/23 1026  Resp 14 03/20/23 1026  SpO2 100 % 03/20/23 1026  Vitals shown include unfiled device data.  Last Pain:  Vitals:   03/20/23 0554  TempSrc: Oral  PainSc: 0-No pain         Complications: No notable events documented.

## 2023-03-20 NOTE — Op Note (Signed)
 03/20/2023  10:31 AM  PATIENT:  Linda Shelton  38 y.o. 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 H/O INTUSSUSCEPTION  POST-OPERATIVE DIAGNOSIS:  SAME + PETERSEN'S HERNIA DEFECT  PROCEDURE:  Procedure(s): LAPAROSCOPIC REVISION OF jejunojejunostomy, LAPAROSCOPIC CLOSURE OF MESENTERY DEFECT AT Petersons space; laparoscopic placement of bilateral tap block  SURGEON:  Surgeon(s): [name], MD   ASSISTANTS: Kinsinger, De Blanch, MD (an assistant was needed and requested for aid in tissue retraction and manipulation)  ANESTHESIA:   general  DRAINS: none   LOCAL MEDICATIONS USED:  MARCAINE     SPECIMEN:  Source of Specimen:  Jejunojejunostomy  DISPOSITION OF SPECIMEN:  PATHOLOGY  COUNTS:  YES  INDICATION FOR PROCEDURE: Patient has a remote history of laparoscopic Roux-en-Y gastric bypass in outside facility who has had chronic colicky abdominal pain associate with nausea for many years.  It is mainly right-sided but can occur left-sided.  She gets bloating with it.  She had previously presented a few years ago with CT evidence of a small bowel intussusception at her jejunojejunostomy and underwent exploratory laparotomy and was found to have no evidence of intussusception at that time.  She underwent laparoscopic cholecystectomy and diagnostic laparoscopy in April 2023.  At that time I just saw an adhesive band of omentum that appeared to be kinking off portions of her common channel which was lysed.  We did not see any evidence of a Petersons hernia at that time.  Her jejunojejunostomy did not appear overtly atypical at that time.  Patient has had continued ongoing abdominal pain.  She has had CT evidence of dilation of her jejunojejunostomy.  She had inquired about gastric bypass reversal in order to address her chronic abdominal pain.  We discussed that with the typical gastric bypass reversal we just reconnected the pouch to the  gastric remnant and leave the small bowel connection alone.  Therefore I recommended diagnostic laparoscopy and resection and recreation of her jejunojejunostomy.  Risk and benefits were discussed and separately documented.  Findings: Again patient has an antecolic antigastric Roux and Y gastric bypass.  Her transverse colon appeared dilated but no signs of mechanical obstruction.  There was no mesenteric defect at her jejunojejunostomy however the the jejunojejunostomy appeared dilated.  There was a defect at [place] space although there is no signs of bowel going through that creating an internal hernia we did close that space to prevent future potential internal hernia.  We resected her old jejunojejunostomy and recreated to new connections described below  PROCEDURE: After obtaining informed consent the patient was taken the OR to at [facility] and placed supine on the operating room table.  General endotracheal anesthesia was established.  Sequential compression devices were placed.  Her abdomen was prepped and draped in the usual standard surgical fashion with ChloraPrep.  She received IV antibiotic prior to skin incision.  A surgical timeout was performed.  Access was gained to the abdomen using the Optiview technique in the left upper quadrant.  A small incision was made below the left subcostal margin and then using a 0 degree 5 mm laparoscope to a 5 mm trocar I carefully advanced it through the abdominal wall and entered the abdominal cavity.  Pneumoperitoneum was smoothly established up to a patient pressure of 15 mmHg without any change in patient vitals.  The laparoscope was advanced and the abdominal cavity was surveilled.  There was no evidence of injury to surrounding structures.  Additional trocars  were placed.  A 5 mm trocar to the left of the umbilicus, a 5 mm trocar in the right lateral abdominal wall, a 12 mm trocar in the right mid abdomen and an additional assistant trocar in  the left lateral abdominal wall.  A bilateral laparoscopic tap block was performed for postoperative pain relief.  Gross inspection of the bowel was performed.  The transverse colon appeared dilated.  We identified her Roux limb and traced it down to her jejunojejunostomy.  Her jejunojejunostomy appeared dilated.  We ran the biliopancreatic limb starting at the jejunojejunostomy all the way up to the ligament of Treitz.  It measured around 50 to 55 cm.  We then ran the common channel all the way down to the cecum.  There was no mesenteric defect at the jejunojejunostomy.  There was no other small bowel pathology.  We looked at [facility] space and there appeared to be a gap between the Roux limb mesentery and the transverse colon where bowel could telescope so we closed this defect with a running 2-0 silk Endo Stitch.  Thus this close the [place] space.  We then turned our attention to resecting the jejunojejunostomy.  I first divided the Roux limb just proximal to the jejunojejunostomy with an Echelon 60 stapler with a white load.  Took down a little bit of the mesentery with harmonic scalpel.  In order to minimize confusion I went ahead and reconnected the Roux limb distal to the old jejunojejunostomy about 5 cm below before dividing the BP limb.  A side-to-side functional end-to-end stapled anastomosis was created between the Roux limb and the common channel.  Enterotomy was made in the Roux limb and in the common channel with harmonic scalpel.  An Echelon 60 stapler with a white load was placed through each of the enterotomies brought together and fired to create a common channel. The common enterotomy was closed with a running 2-0 Vicryl begun at either end of the enterotomy and tied centrally. Vistaseal tissue sealant was placed over the anastomosis. The mesenteric defect was then closed with running 2-0 silk.  At this point, I then completely excised the old jejunojejunostomy by dividing the  biliopancreatic limb with another fire of the Echelon 60 stapler with a white load.  This completely freed the old jejunojejunostomy it was placed in the left upper quadrant.  I then with the aid of the assistant reconnected the biliopancreatic limb to the common channel about 10 cm distal to the first new anastomosis (roux en y/alimentary limb to common channel).  This was also done in a side-to-side functional end-to-end manner. This was accomplished with a single firing of the 60 mm white load linear stapler after making enterotomies with harmonic scalpel. The common enterotomy was closed with a running 2-0 Vicryl begun at either end of the enterotomy and tied centrally. Vistaseal tissue sealant was placed over the anastomosis. The mesenteric defect was then closed with running 2-0 silk.  I then reran the bowel again.  There is no evidence of enterotomy.  There was no evidence of bleeding.  There was good hemostasis.  I am reinspected both new anastomoses-the Roux limb to the common channel and the BP limb about 10 cm distal to that to the common channel.  I reinspected both mesenteric closures and each of those anastomoses and there was no gap.  I reinspected Petersons space and that appeared closed.  We then placed the old jejunojejunostomy and a Ecco sac.  I enlarged the fascia slightly  with 8 of a Kelly and extracted the specimen in its entirety.  The fascial defect was closed with 2 interrupted 0 Vicryl sutures with PMI suture passer with laparoscopic guidance.  Local was infiltrated.  Pneumoperitoneum was released.  Trocars were removed.  Skin incisions were closed with Monocryl 4 oh in a subcuticular space.  Dermabond was applied.  All needle, instrument, and sponge counts were correct x 2.  There were no immediate complications.  The patient was extubated and taken recovery in stable condition  PLAN OF CARE: Admit for overnight observation  PATIENT DISPOSITION:  PACU - hemodynamically stable.    Delay start of Pharmacological VTE agent (>24hrs) due to surgical blood loss or risk of bleeding:  no  Mary Sella. Andrey Campanile, MD, FACS General, Bariatric, & Minimally Invasive Surgery [facility], [place]

## 2023-03-21 ENCOUNTER — Encounter (HOSPITAL_COMMUNITY): Payer: Self-pay | Admitting: General Surgery

## 2023-03-21 DIAGNOSIS — K458 Other specified abdominal hernia without obstruction or gangrene: Secondary | ICD-10-CM | POA: Diagnosis not present

## 2023-03-21 LAB — CBC WITH DIFFERENTIAL/PLATELET
Abs Immature Granulocytes: 0.03 10*3/uL (ref 0.00–0.07)
Basophils Absolute: 0 10*3/uL (ref 0.0–0.1)
Basophils Relative: 0 %
Eosinophils Absolute: 0 10*3/uL (ref 0.0–0.5)
Eosinophils Relative: 0 %
HCT: 26.4 % — ABNORMAL LOW (ref 36.0–46.0)
Hemoglobin: 8.3 g/dL — ABNORMAL LOW (ref 12.0–15.0)
Immature Granulocytes: 0 %
Lymphocytes Relative: 24 %
Lymphs Abs: 2.3 10*3/uL (ref 0.7–4.0)
MCH: 23.7 pg — ABNORMAL LOW (ref 26.0–34.0)
MCHC: 31.4 g/dL (ref 30.0–36.0)
MCV: 75.4 fL — ABNORMAL LOW (ref 80.0–100.0)
Monocytes Absolute: 0.7 10*3/uL (ref 0.1–1.0)
Monocytes Relative: 7 %
Neutro Abs: 6.7 10*3/uL (ref 1.7–7.7)
Neutrophils Relative %: 69 %
Platelets: 319 10*3/uL (ref 150–400)
RBC: 3.5 MIL/uL — ABNORMAL LOW (ref 3.87–5.11)
RDW: 17.2 % — ABNORMAL HIGH (ref 11.5–15.5)
WBC: 9.7 10*3/uL (ref 4.0–10.5)
nRBC: 0 % (ref 0.0–0.2)

## 2023-03-21 LAB — COMPREHENSIVE METABOLIC PANEL
ALT: 48 U/L — ABNORMAL HIGH (ref 0–44)
AST: 46 U/L — ABNORMAL HIGH (ref 15–41)
Albumin: 3.6 g/dL (ref 3.5–5.0)
Alkaline Phosphatase: 79 U/L (ref 38–126)
Anion gap: 9 (ref 5–15)
BUN: 6 mg/dL (ref 6–20)
CO2: 23 mmol/L (ref 22–32)
Calcium: 9.1 mg/dL (ref 8.9–10.3)
Chloride: 105 mmol/L (ref 98–111)
Creatinine, Ser: 0.62 mg/dL (ref 0.44–1.00)
GFR, Estimated: >60 mL/min (ref >60)
Glucose, Bld: 115 mg/dL — ABNORMAL HIGH (ref 70–99)
Potassium: 3.8 mmol/L (ref 3.5–5.1)
Sodium: 137 mmol/L (ref 135–145)
Total Bilirubin: 0.6 mg/dL (ref 0.0–1.2)
Total Protein: 7.2 g/dL (ref 6.5–8.1)

## 2023-03-21 NOTE — Plan of Care (Signed)
   Problem: Education: Goal: Knowledge of General Education information will improve Description Including pain rating scale, medication(s)/side effects and non-pharmacologic comfort measures Outcome: Progressing   Problem: Health Behavior/Discharge Planning: Goal: Ability to manage health-related needs will improve Outcome: Progressing

## 2023-03-21 NOTE — Progress Notes (Signed)
 1 Day Post-Op   Subjective/Chief Complaint: Right sided pain Has had water; apple juice; protein shake feels heavy on stomach Asks about broth   Objective: Vital signs in last 24 hours: Temp:  [97.7 F (36.5 C)-99 F (37.2 C)] 98.1 F (36.7 C) (02/25 0528) Pulse Rate:  [55-98] 63 (02/25 0528) Resp:  [12-19] 18 (02/25 0528) BP: (94-123)/(60-88) 117/83 (02/25 0528) SpO2:  [92 %-100 %] 98 % (02/25 0528) Last BM Date : 03/19/23  Intake/Output from previous day: 02/24 0701 - 02/25 0700 In: 2629.9 [P.O.:600; I.V.:2029.9] Out: 1570 [Urine:1550; Blood:20] Intake/Output this shift: No intake/output data recorded.  Alert, nontoxic Symm chest rise nonlabored Reg Soft, mild Approp TTP, incisions ok  .cbc  Lab Results:  Recent Labs    03/21/23 0447  WBC 9.7  HGB 8.3*  HCT 26.4*  PLT 319   BMET Recent Labs    03/21/23 0447  NA 137  K 3.8  CL 105  CO2 23  GLUCOSE 115*  BUN 6  CREATININE 0.62  CALCIUM 9.1   PT/INR No results for input(s): "LABPROT", "INR" in the last 72 hours. ABG No results for input(s): "PHART", "HCO3" in the last 72 hours.  Invalid input(s): "PCO2", "PO2"  Studies/Results: No results found.  Anti-infectives: Anti-infectives (From admission, onward)    Start     Dose/Rate Route Frequency Ordered Stop   03/20/23 0730  cefoTEtan (CEFOTAN) 2 g in sodium chloride 0.9 % 100 mL IVPB        2 g 200 mL/hr over 30 Minutes Intravenous  Once 03/20/23 0728 03/20/23 0803       Assessment/Plan: s/p Procedure(s): LAPAROSCOPIC REVISION OFjejunojejunostomy , LAPAROSCOPIC CLOSURE OF MESENTERY DEFECT (N/A)  Remote h/o Lap roux en y gastric bypass at outside facility Chronic anemia due to malabsorption  No fever, no tachy; no clinical signs of leak Cont bari clear liquid diet; may adv to bari fulls later today Vte prophylaxis - scds, subcu heparin Repeat cbc in am D/w surgical findings with pt. Right abd pain prob from fascial closure ambulate   LOS: 0 days    [name] 03/21/2023

## 2023-03-22 DIAGNOSIS — K909 Intestinal malabsorption, unspecified: Secondary | ICD-10-CM | POA: Diagnosis present

## 2023-03-22 DIAGNOSIS — D649 Anemia, unspecified: Secondary | ICD-10-CM | POA: Diagnosis present

## 2023-03-22 DIAGNOSIS — K660 Peritoneal adhesions (postprocedural) (postinfection): Secondary | ICD-10-CM | POA: Diagnosis present

## 2023-03-22 DIAGNOSIS — Z9884 Bariatric surgery status: Secondary | ICD-10-CM | POA: Diagnosis not present

## 2023-03-22 DIAGNOSIS — G8929 Other chronic pain: Secondary | ICD-10-CM | POA: Diagnosis present

## 2023-03-22 DIAGNOSIS — R109 Unspecified abdominal pain: Secondary | ICD-10-CM | POA: Diagnosis present

## 2023-03-22 DIAGNOSIS — Z880 Allergy status to penicillin: Secondary | ICD-10-CM | POA: Diagnosis not present

## 2023-03-22 DIAGNOSIS — M069 Rheumatoid arthritis, unspecified: Secondary | ICD-10-CM | POA: Diagnosis present

## 2023-03-22 DIAGNOSIS — K458 Other specified abdominal hernia without obstruction or gangrene: Secondary | ICD-10-CM | POA: Diagnosis not present

## 2023-03-22 LAB — CBC WITH DIFFERENTIAL/PLATELET
Abs Immature Granulocytes: 0.06 10*3/uL (ref 0.00–0.07)
Basophils Absolute: 0 10*3/uL (ref 0.0–0.1)
Basophils Relative: 0 %
Eosinophils Absolute: 0.1 10*3/uL (ref 0.0–0.5)
Eosinophils Relative: 1 %
HCT: 26 % — ABNORMAL LOW (ref 36.0–46.0)
Hemoglobin: 8.1 g/dL — ABNORMAL LOW (ref 12.0–15.0)
Immature Granulocytes: 1 %
Lymphocytes Relative: 28 %
Lymphs Abs: 2.3 10*3/uL (ref 0.7–4.0)
MCH: 23.7 pg — ABNORMAL LOW (ref 26.0–34.0)
MCHC: 31.2 g/dL (ref 30.0–36.0)
MCV: 76 fL — ABNORMAL LOW (ref 80.0–100.0)
Monocytes Absolute: 0.6 10*3/uL (ref 0.1–1.0)
Monocytes Relative: 7 %
Neutro Abs: 5.2 10*3/uL (ref 1.7–7.7)
Neutrophils Relative %: 63 %
Platelets: 291 10*3/uL (ref 150–400)
RBC: 3.42 MIL/uL — ABNORMAL LOW (ref 3.87–5.11)
RDW: 17.5 % — ABNORMAL HIGH (ref 11.5–15.5)
WBC: 8.2 10*3/uL (ref 4.0–10.5)
nRBC: 0 % (ref 0.0–0.2)

## 2023-03-22 LAB — SURGICAL PATHOLOGY

## 2023-03-22 MED ORDER — PROSOURCE PLUS PO LIQD
30.0000 mL | Freq: Three times a day (TID) | ORAL | Status: DC
  Administered 2023-03-22 (×2): 30 mL via ORAL
  Filled 2023-03-22 (×2): qty 30

## 2023-03-22 MED ORDER — ONDANSETRON 4 MG PO TBDP: 4.0000 mg | ORAL_TABLET | Freq: Four times a day (QID) | ORAL | 0 refills | Status: AC | PRN

## 2023-03-22 MED ORDER — ACETAMINOPHEN 500 MG PO TABS: 1000.0000 mg | ORAL_TABLET | Freq: Three times a day (TID) | ORAL | Status: AC

## 2023-03-22 MED ORDER — PANTOPRAZOLE SODIUM 40 MG PO TBEC
40.0000 mg | DELAYED_RELEASE_TABLET | Freq: Two times a day (BID) | ORAL | Status: DC
  Administered 2023-03-22: 40 mg via ORAL
  Filled 2023-03-22: qty 1

## 2023-03-22 MED ORDER — GABAPENTIN 100 MG PO CAPS
100.0000 mg | ORAL_CAPSULE | Freq: Two times a day (BID) | ORAL | 0 refills | Status: AC
Start: 2023-03-22 — End: 2023-03-27

## 2023-03-22 MED ORDER — TRAMADOL HCL 50 MG PO TABS
50.0000 mg | ORAL_TABLET | Freq: Four times a day (QID) | ORAL | Status: DC | PRN
  Administered 2023-03-22: 50 mg via ORAL
  Filled 2023-03-22: qty 1

## 2023-03-22 MED ORDER — TRAMADOL HCL 50 MG PO TABS: 50.0000 mg | ORAL_TABLET | Freq: Four times a day (QID) | ORAL | 0 refills | Status: AC | PRN

## 2023-03-22 NOTE — Progress Notes (Signed)
 Nutrition Education Note  Received consult for diet education for patient with history of RNY gastric bypass in 2012.  Pt is s/p LAPAROSCOPIC REVISION OF jejunojejunostomy , LAPAROSCOPIC CLOSURE OF MESENTERY DEFECT 03/20/23  Patient reports prior to surgery, she was having issues with reflux and was having trouble tolerating PO intakes. She is aware of foods that she should avoid given her surgical history. Lately has only been able to tolerate breads, chicken and rice. Was unable to be consistent with daily MVI and supplements since August 2024.   Pt today states she is tolerating clear liquids like ginger ale, coffee. When tries to drink Ensure Max creates a heavy sensation. Trying Unjury chicken soup but states this causes a similar feeling. Willing to try Prosource supplements as they are lower volume option (providing 100 kcals and 15g protein per 30 ml).  Ordered a bariatric full tray consisting of yogurt, Lactaid milk and cream soup for patient try today.  Bariatric nurse coordinator provided copy of liquid options pt can consume at bedside. Per Dr. Andrey Campanile, aims for patient to stay on bariatric full liquids for a week prior to transitioning to solid diet again.  Requested soft protein diet handout from Bariatric Nurse Coordinator.  Will continue to monitor.  Tilda Franco, MS, RD, LDN Inpatient Clinical Dietitian Contact via Secure chat

## 2023-03-22 NOTE — Progress Notes (Signed)
 2 Days Post-Op   Subjective/Chief Complaint: Walked some Has had water; protein shakes continue to feel heavy on stomach Did a little broth Was not able to finish protein shake - too sour on stomach   Objective: Vital signs in last 24 hours: Temp:  [98.5 F (36.9 C)] 98.5 F (36.9 C) (02/26 0701) Pulse Rate:  [64-72] 72 (02/26 0701) Resp:  [16-18] 16 (02/26 0701) BP: (103-116)/(62-72) 103/62 (02/26 0701) SpO2:  [97 %-100 %] 97 % (02/26 0701) Last BM Date : 03/19/23  Intake/Output from previous day: 02/25 0701 - 02/26 0700 In: 1262.1 [P.O.:300; I.V.:962.1] Out: 2000 [Urine:2000] Intake/Output this shift: Total I/O In: 240 [P.O.:240] Out: 300 [Urine:300]  Alert, nontoxic Symm chest rise nonlabored Reg Soft, mild Approp TTP, incisions ok  .cbc  Lab Results:  Recent Labs    03/21/23 0447 03/22/23 0512  WBC 9.7 8.2  HGB 8.3* 8.1*  HCT 26.4* 26.0*  PLT 319 291   BMET Recent Labs    03/21/23 0447  NA 137  K 3.8  CL 105  CO2 23  GLUCOSE 115*  BUN 6  CREATININE 0.62  CALCIUM 9.1   PT/INR No results for input(s): "LABPROT", "INR" in the last 72 hours. ABG No results for input(s): "PHART", "HCO3" in the last 72 hours.  Invalid input(s): "PCO2", "PO2"  Studies/Results: No results found.  Anti-infectives: Anti-infectives (From admission, onward)    Start     Dose/Rate Route Frequency Ordered Stop   03/20/23 0730  cefoTEtan (CEFOTAN) 2 g in sodium chloride 0.9 % 100 mL IVPB        2 g 200 mL/hr over 30 Minutes Intravenous  Once 03/20/23 0728 03/20/23 0803       Assessment/Plan: s/p Procedure(s): LAPAROSCOPIC REVISION OFjejunojejunostomy , LAPAROSCOPIC CLOSURE OF MESENTERY DEFECT (N/A)  Remote h/o Lap roux en y gastric bypass at outside facility Chronic anemia due to malabsorption  No fever, no tachy; no clinical signs of leak  adv to bari fulls later today; will try chicken soup protein shake Bari nurse to visit pt Switch back to oral  protonix bid Will ask nutrition to see pt - h/o gastric bypass; resected and redid JJ; would like pt to stay on bari fulls for 1 week before going back to solid food.  Vte prophylaxis - scds, subcu heparin  Ambulate  Not sure if pt will meet dc criteria - need to see better protein intake  LOS: 0 days    Linda Shelton 03/22/2023

## 2023-03-22 NOTE — Progress Notes (Signed)
 Soft protein diet handout also provided to patient in addition to the full liquid diet.    Thank you,  Lubertha Basque, RN, MSN Bariatric Nurse Coordinator [phone] (office)

## 2023-03-22 NOTE — Progress Notes (Signed)
 Pt discharged to home. Discharged instructions given. All belongings taken by pt.

## 2023-03-22 NOTE — Discharge Instructions (Signed)
 GASTRIC BYPASS/SLEEVE  Home Care Instructions   These instructions are to help you care for yourself when you go home.  Call: If you have any problems. Call [phone] and ask for the surgeon on call If you need immediate help, come to the ER at [facility].  Tell the ER staff that you are a new post-op gastric bypass or gastric sleeve patient   Signs and symptoms to report: Severe vomiting or nausea If you cannot keep down clear liquids for longer than 1 day, call your surgeon  Abdominal pain that does not get better after taking your pain medication Fever over 100.4 F with chills Heart beating over 100 beats a minute Shortness of breath at rest Chest pain  Redness, swelling, drainage, or foul odor at incision (surgical) sites  If your incisions open or pull apart Swelling or pain in calf (lower leg) Diarrhea (Loose bowel movements that happen often), frequent watery, uncontrolled bowel movements Constipation, (no bowel movements for 3 days) if this happens: Pick one Milk of Magnesia, 2 tablespoons by mouth, 3 times a day for 2 days if needed Stop taking Milk of Magnesia once you have a bowel movement Call your doctor if constipation continues Or Miralax  (instead of Milk of Magnesia) following the label instructions Stop taking Miralax once you have a bowel movement Call your doctor if constipation continues Anything you think is not normal   Normal side effects after surgery: Unable to sleep at night or unable to focus Irritability or moody Being tearful (crying) or depressed These are common complaints, possibly related to your anesthesia medications that put you to sleep, stress of surgery, and change in lifestyle.  This usually goes away a few weeks after surgery.  If these feelings continue, call your primary care doctor.   Wound Care: You may have surgical glue, steri-strips, or staples over your incisions after surgery Surgical glue:  Looks like a clear film  over your incisions and will wear off a little at a time Steri-strips: Strips of tape over your incisions. You may notice a yellowish color on the skin under the steri-strips. This is used to make the   steri-strips stick better. Do not pull the steri-strips off - let them fall off Staples: Staples may be removed before you leave the hospital If you go home with staples, call [facility], [phone] at for an appointment with your surgeon's nurse to have staples removed 10 days after surgery. Showering: You may shower two (2) days after your surgery unless your surgeon tells you differently Wash gently around incisions with warm soapy water, rinse well, and gently pat dry  No tub baths until staples are removed, steri-strips fall off or glue is gone.    Medications: Medications should be liquid or crushed if larger than the size of a dime Extended release pills (medication that release a little bit at a time through the day) should NOT be crushed or cut. (examples include XL, ER, DR, SR) Depending on the size and number of medications you take, you may need to space (take a few throughout the day)/change the time you take your medications so that you do not over-fill your pouch (smaller stomach) Make sure you follow-up with your primary care doctor to make medication changes needed during rapid weight loss and life-style changes If you have diabetes, follow up with the doctor that orders your diabetes medication(s) within one week after surgery and check your blood sugar regularly.  Do not drive while taking prescription pain medication  It is ok to take Tylenol by the bottle instructions with your pain medicine or instead of your pain medicine as needed.  DO NOT TAKE NSAIDS (EXAMPLES OF NSAIDS:  IBUPROFREN/ NAPROXEN)  Diet:                    First 2 Weeks  You will see the dietician t about two (2) weeks after your surgery. The dietician will increase the types of foods you can eat  if you are handling liquids well: If you have severe vomiting or nausea and cannot keep down clear liquids lasting longer than 1 day, call your surgeon @ [phone]) Protein Shake Drink at least 2 ounces of shake 5-6 times per day Each serving of protein shakes (usually 8 - 12 ounces) should have: 15 grams of protein  And no more than 5 grams of carbohydrate  Goal for protein each day: Men = 80 grams per day Women = 60 grams per day Protein powder may be added to fluids such as non-fat milk or Lactaid milk or unsweetened Soy/Almond milk (limit to 35 grams added protein powder per serving)  Hydration Slowly increase the amount of water and other clear liquids as tolerated (See Acceptable Fluids) Slowly increase the amount of protein shake as tolerated   Sip fluids slowly and throughout the day.  Do not use straws. May use sugar substitutes in small amounts (no more than 6 - 8 packets per day; i.e. Splenda)  Fluid Goal The first goal is to drink at least 8 ounces of protein shake/drink per day (or as directed by the nutritionist); some examples of protein shakes are [employer], Dillard's, EAS Edge HP, and Unjury. See handout from pre-op Bariatric Education Class: Slowly increase the amount of protein shake you drink as tolerated You may find it easier to slowly sip shakes throughout the day It is important to get your proteins in first Your fluid goal is to drink 64 - 100 ounces of fluid daily It may take a few weeks to build up to this 32 oz (or more) should be clear liquids  And  32 oz (or more) should be full liquids (see below for examples) Liquids should not contain sugar, caffeine, or carbonation  Clear Liquids: Water or Sugar-free flavored water (i.e. Fruit H2O, Propel) Decaffeinated coffee or tea (sugar-free) Crystal Lite, Wyler's Lite, Minute Maid Lite Sugar-free Jell-O Bouillon or broth Sugar-free Popsicle:   *Less than 20 calories each; Limit 1 per  day  Full Liquids: Protein Shakes/Drinks + 2 choices per day of other full liquids Full liquids must be: No More Than 15 grams of Carbs per serving  No More Than 3 grams of Fat per serving Strained low-fat cream soup (except Cream of Potato or Tomato) Non-Fat milk Fat-free Lactaid Milk Unsweetened Soy Or Unsweetened Almond Milk Low Sugar yogurt (Dannon Lite & Fit, Greek yogurt; Oikos Triple Zero; Chobani Simply 100; Yoplait 100 calorie [place] - No Fruit on the Bottom)    Vitamins and Minerals Start 1 day after surgery unless otherwise directed by your surgeon 2 Chewable Bariatric Specific Multivitamin / Multimineral Supplement with iron (Example: Bariatric Advantage Multi EA) Chewable Calcium with Vitamin D-3 (Example: 3 Chewable Calcium Plus 600 with Vitamin D-3) Take 500 mg three (3) times a day for a total of 1500 mg each day Do not take all 3 doses of calcium at one time as it may cause constipation, and  you can only absorb 500 mg  at a time  Do not mix multivitamins containing iron with calcium supplements; take 2 hours apart Menstruating women and those with a history of anemia (a blood disease that causes weakness) may need extra iron Talk with your doctor to see if you need more iron Do not stop taking or change any vitamins or minerals until you talk to your dietitian or surgeon Your Dietitian and/or surgeon must approve all vitamin and mineral supplements   Activity and Exercise: Limit your physical activity as instructed by your doctor.  It is important to continue walking at home.  During this time, use these guidelines: Do not lift anything greater than ten (10) pounds for at least two (2) weeks Do not go back to work or drive until Designer, industrial/product says you can You may have sex when you feel comfortable  It is VERY important for female patients to use a reliable birth control method; fertility often increases after surgery  All hormonal birth control will be ineffective for 30  days after surgery due to medications given during surgery a barrier method must be used. Do not get pregnant for at least 18 months Start exercising as soon as your doctor tells you that you can Make sure your doctor approves any physical activity Start with a simple walking program Walk 5-15 minutes each day, 7 days per week.  Slowly increase until you are walking 30-45 minutes per day Consider joining our BELT program. [phone] or email [email or web address] .[email or web address]   Special Instructions Things to remember: Use your CPAP when sleeping if this applies to you  [facility] has two free Bariatric Surgery Support Groups that meet monthly The 3rd Thursday of each month, 6 pm, [facility] Classrooms  The 2nd Friday of each month, 11:45 am in the private dining room in the basement of [facility] It is very important to keep all follow up appointments with your surgeon, dietitian, primary care physician, and behavioral health practitioner Routine follow up schedule with your surgeon include appointments at 2-3 weeks, 6-8 weeks, 6 months, and 1 year at a minimum.  Your surgeon may request to see you more often.   After the first year, please follow up with your bariatric surgeon and dietitian at least once a year in order to maintain best weight loss results [facility]: [phone] [facility]: [phone] Bariatric Nurse Coordinator: [phone]      Reviewed and Endorsed  by [facility] Patient Education Committee, June, 2016 Edits Approved: Aug, 2018

## 2023-03-23 NOTE — Discharge Summary (Signed)
 Physician Discharge Summary  Linda Shelton ZOX:[id number] DOB: [birth year] DOA: 03/20/2023  PCP: [facility]  Admit date: 03/20/2023 Discharge date: 03/22/2023  Recommendations for Outpatient Follow-up:     Follow-up Information     [name], MD. Schedule an appointment as soon as possible for a visit in 3 week(s).   Specialty: General Surgery Why: For wound re-check Contact information: 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  It is mainly right-sided but can occur left-sided.  She gets bloating with it.  She had previously presented a few years ago with CT evidence of a small bowel intussusception at her jejunojejunostomy and underwent exploratory laparotomy and was found to have no evidence of intussusception at that time.  She underwent laparoscopic cholecystectomy and diagnostic laparoscopy in April 2023.  At that time I just saw an adhesive band of omentum that appeared to be kinking off portions of her common channel which was lysed.  We did not see any evidence of a Petersons hernia at that time.  Her jejunojejunostomy did not appear overtly atypical at that time.  Patient has had continued ongoing abdominal pain.  She has had CT evidence of dilation of her  jejunojejunostomy.  She had inquired about gastric bypass reversal in order to address her chronic abdominal pain.  We discussed that with the typical gastric bypass reversal we just reconnected the pouch to the gastric remnant and leave the small bowel connection alone.  Therefore I recommended diagnostic laparoscopy and resection and recreation of her jejunojejunostomy.  Risk and benefits were discussed and separately documented.    Hospital Course:  Patient came in for planned diagnostic laparoscopy as well as laparoscopic resection of her old jejunojejunostomy with recreation.  We also found a defect at [place] space and closed that.  She was Postoperatively.  She was started on bariatric clears on postop day 0.  On postop day 1 she was mainly just tolerating water.  She felt that protein shakes were very heavy and caused a sourness on her stomach.  She had some abdominal wall pain.  By postoperative day 2 she was ambulating better.  Her pain was better controlled.  She was switched back to oral reflux medication.  Her oral intake improved.  Bariatric nurse as well as nutritional education was performed by the dietitian.  She was felt stable for discharge     Discharge Instructions  Discharge Instructions     Ambulate hourly while awake   Complete by: As directed    Call MD for:  difficulty breathing, headache or visual disturbances   Complete by: As directed    Call MD for:  persistant dizziness or light-headedness   Complete by: As directed    Call MD for:  persistant nausea and vomiting   Complete by: As directed  Call MD for:  redness, tenderness, or signs of infection (pain, swelling, redness, odor or green/yellow discharge around incision site)   Complete by: As directed    Call MD for:  severe uncontrolled pain   Complete by: As directed    Call MD for:  temperature >101 F   Complete by: As directed    Diet bariatric full liquid   Complete by: As directed    Discharge  instructions   Complete by: As directed    See bariatric discharge instructions   Incentive spirometry   Complete by: As directed    Perform hourly while awake      Allergies as of 03/22/2023       Reactions   Amoxicillin-pot Clavulanate Other (See Comments)   Stomach upset/pain        Medication List     TAKE these medications    acetaminophen 500 MG tablet Commonly known as: TYLENOL Take 2 tablets (1,000 mg total) by mouth every 8 (eight) hours for 5 days.   cetirizine 10 MG tablet Commonly known as: ZYRTEC Take 10 mg by mouth daily as needed for allergies.   fluticasone 50 MCG/ACT nasal spray Commonly known as: FLONASE Place 1 spray into both nostrils daily as needed for allergies or rhinitis.   gabapentin 100 MG capsule Commonly known as: NEURONTIN Take 1 capsule (100 mg total) by mouth every 12 (twelve) hours for 5 days.   Krill Oil 500 MG Caps Take 500 mg by mouth daily.   ondansetron 4 MG disintegrating tablet Commonly known as: ZOFRAN-ODT Take 1 tablet (4 mg total) by mouth every 6 (six) hours as needed for nausea or vomiting.   pantoprazole 40 MG tablet Commonly known as: PROTONIX Take 40 mg by mouth 2 (two) times daily.   predniSONE 5 MG tablet Commonly known as: DELTASONE Take 5 mg by mouth daily.   prenatal multivitamin Tabs tablet Take 1 tablet by mouth daily.   PROBIOTIC PO Take 1 capsule by mouth daily.   traMADol 50 MG tablet Commonly known as: ULTRAM Take 1 tablet (50 mg total) by mouth every 6 (six) hours as needed (pain).   VITAMIN D3-VITAMIN C PO Take 1 tablet by mouth daily. With zinc        Follow-up Information     [name], MD. Schedule an appointment as soon as possible for a visit in 3 week(s).   Specialty: General Surgery Why: For wound re-check Contact information: [address] [place] [address] [phone]                  The results of significant diagnostics from this  hospitalization (including imaging, microbiology, ancillary and laboratory) are listed below for reference.    Significant Diagnostic Studies: No results found.  Microbiology: No results found for this or any previous visit (from the past 240 hours).   Labs: Basic Metabolic Panel: Recent Labs  Lab 03/21/23 0447  NA 137  K 3.8  CL 105  CO2 23  GLUCOSE 115*  BUN 6  CREATININE 0.62  CALCIUM 9.1   Liver Function Tests: Recent Labs  Lab 03/21/23 0447  AST 46*  ALT 48*  ALKPHOS 79  BILITOT 0.6  PROT 7.2  ALBUMIN 3.6   No results for input(s): "LIPASE", "AMYLASE" in the last 168 hours. No results for input(s): "AMMONIA" in the last 168 hours. CBC: Recent Labs  Lab 03/21/23 0447 03/22/23 0512  WBC 9.7 8.2  NEUTROABS 6.7 5.2  HGB 8.3* 8.1*  HCT 26.4* 26.0*  MCV 75.4* 76.0*  PLT 319 291   Cardiac Enzymes: No results for input(s): "CKTOTAL", "CKMB", "CKMBINDEX", "TROPONINI" in the last 168 hours. BNP: BNP (last 3 results) No results for input(s): "BNP" in the last 8760 hours.  ProBNP (last 3 results) No results for input(s): "PROBNP" in the last 8760 hours.  CBG: No results for input(s): "GLUCAP" in the last 168 hours.  Principal Problem:   S/P small bowel resection   Time coordinating discharge: 15 min  Signed:  Atilano Ina, MD [facility] [phone] 03/23/2023, 4:32 PM
